# Patient Record
Sex: Female | Born: 1960 | Race: White | Hispanic: No | Marital: Married | Smoking: Never smoker
Health system: Southern US, Community
[De-identification: ages and names within clinical notes are randomized; demographics above are authoritative.]

## PROBLEM LIST (undated history)

## (undated) DIAGNOSIS — S83209A Unspecified tear of unspecified meniscus, current injury, unspecified knee, initial encounter: Secondary | ICD-10-CM

## (undated) DIAGNOSIS — T7840XA Allergy, unspecified, initial encounter: Secondary | ICD-10-CM

## (undated) DIAGNOSIS — D649 Anemia, unspecified: Secondary | ICD-10-CM

## (undated) DIAGNOSIS — R0602 Shortness of breath: Secondary | ICD-10-CM

## (undated) DIAGNOSIS — I10 Essential (primary) hypertension: Secondary | ICD-10-CM

## (undated) DIAGNOSIS — M4800 Spinal stenosis, site unspecified: Secondary | ICD-10-CM

## (undated) DIAGNOSIS — M549 Dorsalgia, unspecified: Secondary | ICD-10-CM

## (undated) DIAGNOSIS — M1990 Unspecified osteoarthritis, unspecified site: Secondary | ICD-10-CM

## (undated) DIAGNOSIS — C801 Malignant (primary) neoplasm, unspecified: Secondary | ICD-10-CM

## (undated) HISTORY — DX: Allergy, unspecified, initial encounter: T78.40XA

## (undated) HISTORY — DX: Spinal stenosis, site unspecified: M48.00

## (undated) HISTORY — DX: Essential (primary) hypertension: I10

## (undated) HISTORY — DX: Dorsalgia, unspecified: M54.9

## (undated) HISTORY — DX: Shortness of breath: R06.02

## (undated) HISTORY — DX: Malignant (primary) neoplasm, unspecified: C80.1

## (undated) HISTORY — DX: Anemia, unspecified: D64.9

## (undated) HISTORY — DX: Unspecified tear of unspecified meniscus, current injury, unspecified knee, initial encounter: S83.209A

---

## 1982-11-21 HISTORY — PX: TONSILLECTOMY: SUR1361

## 1999-01-25 ENCOUNTER — Other Ambulatory Visit: Admission: RE | Admit: 1999-01-25 | Discharge: 1999-01-25 | Payer: Self-pay | Admitting: Gynecology

## 1999-05-15 ENCOUNTER — Inpatient Hospital Stay (HOSPITAL_COMMUNITY): Admission: AD | Admit: 1999-05-15 | Discharge: 1999-05-15 | Payer: Self-pay | Admitting: Gynecology

## 1999-06-05 ENCOUNTER — Inpatient Hospital Stay (HOSPITAL_COMMUNITY): Admission: AD | Admit: 1999-06-05 | Discharge: 1999-06-05 | Payer: Self-pay | Admitting: Gynecology

## 1999-06-08 ENCOUNTER — Inpatient Hospital Stay (HOSPITAL_COMMUNITY): Admission: AD | Admit: 1999-06-08 | Discharge: 1999-06-08 | Payer: Self-pay | Admitting: Gynecology

## 1999-10-06 ENCOUNTER — Encounter: Admission: RE | Admit: 1999-10-06 | Discharge: 1999-11-26 | Payer: Self-pay | Admitting: Family Medicine

## 2001-02-27 ENCOUNTER — Other Ambulatory Visit: Admission: RE | Admit: 2001-02-27 | Discharge: 2001-02-27 | Payer: Self-pay | Admitting: Gynecology

## 2005-01-24 ENCOUNTER — Emergency Department (HOSPITAL_COMMUNITY): Admission: EM | Admit: 2005-01-24 | Discharge: 2005-01-24 | Payer: Self-pay | Admitting: Family Medicine

## 2013-10-02 ENCOUNTER — Ambulatory Visit: Payer: BC Managed Care – PPO

## 2013-10-02 ENCOUNTER — Ambulatory Visit (INDEPENDENT_AMBULATORY_CARE_PROVIDER_SITE_OTHER): Payer: BC Managed Care – PPO | Admitting: Family Medicine

## 2013-10-02 VITALS — BP 160/88 | HR 70 | Temp 98.0°F | Resp 18 | Ht 64.0 in | Wt 258.4 lb

## 2013-10-02 DIAGNOSIS — R079 Chest pain, unspecified: Secondary | ICD-10-CM

## 2013-10-02 DIAGNOSIS — R05 Cough: Secondary | ICD-10-CM

## 2013-10-02 DIAGNOSIS — R0602 Shortness of breath: Secondary | ICD-10-CM

## 2013-10-02 DIAGNOSIS — R059 Cough, unspecified: Secondary | ICD-10-CM

## 2013-10-02 DIAGNOSIS — J209 Acute bronchitis, unspecified: Secondary | ICD-10-CM

## 2013-10-02 LAB — POCT CBC
Granulocyte percent: 53.3 % (ref 37–80)
HCT, POC: 42.8 % (ref 37.7–47.9)
Hemoglobin: 13.1 g/dL (ref 12.2–16.2)
Lymph, poc: 2.2 (ref 0.6–3.4)
MCH, POC: 27.1 pg (ref 27–31.2)
MCHC: 30.6 g/dL — AB (ref 31.8–35.4)
MCV: 88.4 fL (ref 80–97)
MID (cbc): 0.4 (ref 0–0.9)
MPV: 11.2 fL (ref 0–99.8)
POC Granulocyte: 6 (ref 2–6.9)
POC LYMPH PERCENT: 38.7 %L (ref 10–50)
POC MID %: 8 %M (ref 0–12)
Platelet Count, POC: 149 10*3/uL (ref 142–424)
RBC: 4.84 M/uL (ref 4.04–5.48)
RDW, POC: 14.6 %
WBC: 5.6 10*3/uL (ref 4.6–10.2)

## 2013-10-02 MED ORDER — ALBUTEROL SULFATE HFA 108 (90 BASE) MCG/ACT IN AERS: 2.0000 | INHALATION_SPRAY | Freq: Four times a day (QID) | RESPIRATORY_TRACT | Status: DC | PRN

## 2013-10-02 MED ORDER — LEVOFLOXACIN 500 MG PO TABS: 500.0000 mg | ORAL_TABLET | Freq: Every day | ORAL | Status: DC

## 2013-10-02 NOTE — Progress Notes (Deleted)
  Subjective:    Patient ID: Natasha [name], female    DOB: [birth year], 52 y.o.   MRN: [id number]  HPI Pt presents with cough x 7-8 weeks. Pt is nonsmoker. States cough keeps her up at night. She is sob when walking. Her cough is productive. +white mucous with cough. Denies night sweats, fever. In the beginning of the illness she had sore throat, bilateral otalgias. That has resolved. She has a history of bronchitis and PNA, no asthma.    Review of Systems     Objective:   Physical Exam        Assessment & Plan:

## 2013-10-02 NOTE — Patient Instructions (Signed)
Acute Bronchitis Bronchitis is inflammation of the airways that extend from the windpipe into the lungs (bronchi). The inflammation often causes mucus to develop. This leads to a cough, which is the most common symptom of bronchitis.  In acute bronchitis, the condition usually develops suddenly and goes away over time, usually in a couple weeks. Smoking, allergies, and asthma can make bronchitis worse. Repeated episodes of bronchitis may cause further lung problems.  CAUSES Acute bronchitis is most often caused by the same virus that causes a cold. The virus can spread from person to person (contagious).  SIGNS AND SYMPTOMS   Cough.   Fever.   Coughing up mucus.   Body aches.   Chest congestion.   Chills.   Shortness of breath.   Sore throat.  DIAGNOSIS  Acute bronchitis is usually diagnosed through a physical exam. Tests, such as chest X-rays, are sometimes done to rule out other conditions.  TREATMENT  Acute bronchitis usually goes away in a couple weeks. Often times, no medical treatment is necessary. Medicines are sometimes given for relief of fever or cough. Antibiotics are usually not needed but may be prescribed in certain situations. In some cases, an inhaler may be recommended to help reduce shortness of breath and control the cough. A cool mist vaporizer may also be used to help thin bronchial secretions and make it easier to clear the chest.  HOME CARE INSTRUCTIONS  Get plenty of rest.   Drink enough fluids to keep your urine clear or pale yellow (unless you have a medical condition that requires fluid restriction). Increasing fluids may help thin your secretions and will prevent dehydration.   Only take over-the-counter or prescription medicines as directed by your health care provider.   Avoid smoking and secondhand smoke. Exposure to cigarette smoke or irritating chemicals will make bronchitis worse. If you are a smoker, consider using nicotine gum or skin  patches to help control withdrawal symptoms. Quitting smoking will help your lungs heal faster.   Reduce the chances of another bout of acute bronchitis by washing your hands frequently, avoiding people with cold symptoms, and trying not to touch your hands to your mouth, nose, or eyes.   Follow up with your health care provider as directed.  SEEK MEDICAL CARE IF: Your symptoms do not improve after 1 week of treatment.  SEEK IMMEDIATE MEDICAL CARE IF:  You develop an increased fever or chills.   You have chest pain.   You have severe shortness of breath.  You have bloody sputum.   You develop dehydration.  You develop fainting.  You develop repeated vomiting.  You develop a severe headache. MAKE SURE YOU:   Understand these instructions.  Will watch your condition.  Will get help right away if you are not doing well or get worse. Document Released: 12/15/2004 Document Revised: 07/10/2013 Document Reviewed: 04/30/2013 Exit[employer] Patient Information 2014 [employer].  

## 2013-10-02 NOTE — Progress Notes (Signed)
 [facility]:  Office Visit  Chief Complaint:  Chief Complaint  Patient presents with  . Cough    SOB wheezing symptoms since Sept 21     HPI: Natasha [name] is a 52 y.o. female who is here for productive  cough x 7-8 weeks. Pt is nonsmoker. States cough keeps her up at night. She is sob when walking. Her cough is productive. +white mucous with cough. Denies night sweats, fever. In the beginning of the illness she had sore throat, bilateral otalgias. That has resolved. She has a history of bronchitis and PNA, no asthma. She has had to sit up in recliner to sleep early on in her illness, now she is using 2 pillows, denies pedal edema. She has CP when she walks up the stairs. She can do minimal things ie do dishes and feels SOB. No wheezes now but did have some initially. Thought it was bronchitis at first. She has allergies but no ashtma. Was taking Mucinex DM but felt it may not have been helpful. Has not had fevers. Denies any PE/DVT sxs: no recent surgeries, prior PE/DVTs, long travels, asymmteric swelling in legs or leg pain, no malignancies. Denies GERD sxs  She recently lost her father 2 months ago to colon cancer, she is a Hospital doctor for [employer]   Past Medical History  Diagnosis Date  . Allergy   . Anemia   . Spinal stenosis    Past Surgical History  Procedure Laterality Date  . Cesarean section     History   Social History  . Marital Status: Married    Spouse Name: N/A    Number of Children: N/A  . Years of Education: N/A   Social History Main Topics  . Smoking status: Never Smoker   . Smokeless tobacco: Not on file  . Alcohol Use: Not on file  . Drug Use: Not on file  . Sexual Activity: Not on file   Other Topics Concern  . Not on file   Social History Narrative  . No narrative on file   Family History  Problem Relation Age of Onset  . Cancer Father    Allergies  Allergen Reactions  . Codeine Nausea Only   Prior to Admission  medications   Not on File     ROS: The patient denies fevers, chills, night sweats, unintentional weight loss, palpitation, nausea, vomiting, abdominal pain, dysuria, hematuria, melena, numbness, weakness, or tingling.   All other systems have been reviewed and were otherwise negative with the exception of those mentioned in the HPI and as above.    PHYSICAL EXAM: Filed Vitals:   10/02/13 1604  BP: 160/88  Pulse: 70  Temp: 98 F (36.7 C)  Resp: 18   Filed Vitals:   10/02/13 1604  Height: 5\' 4"  (1.626 m)  Weight: 258 lb 6.4 oz (117.209 kg)   Body mass index is 44.33 kg/(m^2).  General: Alert, no acute distress, no increase WOB, + coughing, obese HEENT:  Normocephalic, atraumatic, oropharynx patent. EOMI, PERRLA Cardiovascular:  Regular rate and rhythm, no rubs murmurs or gallops.  No Carotid bruits, radial pulse intact. No pedal edema.  Respiratory: Clear to auscultation bilaterally.  No wheezes, rales, or rhonchi.  No cyanosis, no use of accessory musculature GI: No organomegaly, abdomen is soft and non-tender, positive bowel sounds.  No masses. Skin: No rashes. Neurologic: Facial musculature symmetric. Psychiatric: Patient is appropriate throughout our interaction. Lymphatic: No cervical lymphadenopathy Musculoskeletal: Gait intact.  LABS: Results for orders placed in visit on 10/02/13  POCT CBC      Result Value Range   WBC 5.6  4.6 - 10.2 K/uL   Lymph, poc 2.2  0.6 - 3.4   POC LYMPH PERCENT 38.7  10 - 50 %L   MID (cbc) 0.4  0 - 0.9   POC MID % 8.0  0 - 12 %M   POC Granulocyte 6.0  2 - 6.9   Granulocyte percent 53.3  37 - 80 %G   RBC 4.84  4.04 - 5.48 M/uL   Hemoglobin 13.1  12.2 - 16.2 g/dL   HCT, POC 40.9  81.1 - 47.9 %   MCV 88.4  80 - 97 fL   MCH, POC 27.1  27 - 31.2 pg   MCHC 30.6 (*) 31.8 - 35.4 g/dL   RDW, POC 91.4     Platelet Count, POC 149  142 - 424 K/uL   MPV 11.2  0 - 99.8 fL     EKG/XRAY:   Primary read interpreted by Dr. Conley Rolls at  [facility]. Increase vascular markings vs patchy infiltrate right upper hilar region, please comment.  No effusion, no pneumothorax EKG shows sinus rhythm at 61 bpm, no acute abnormalities Nonspecific ST changes   ASSESSMENT/PLAN: Encounter Diagnoses  Name Primary?  . Cough Yes  . SOB (shortness of breath)   . Acute bronchitis   . 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  Possible PNA vs acute bronchitis with abnormal xray ( I will await for official xray report) Rx Levaquin for infectious etiology, RX Albuterol inhaler, she declined cough meds will take mucinex Take otc anithistamine daily for allergies  F/u in 10 days or sooner if worsening sxs for either repeat xray or other studies prn  Gross sideeffects, risk and benefits, and alternatives of medications d/w patient. Patient is aware that all medications have potential sideeffects and we are unable to predict every sideeffect or drug-drug interaction that may occur.  ,  PHUONG, DO 10/02/2013 6:20 PM  Official Xray Results CLINICAL DATA: Cough for 2 months  EXAM:  CHEST 2 VIEW  COMPARISON: None.  FINDINGS:  Cardiac shadow is within normal limits. Some fullness is noted in  the right hilar and suprahilar region best appreciated on the  frontal exam. No focal infiltrate is seen. No bony abnormality is  noted.  IMPRESSION:  Fullness in the right suprahilar and right paratracheal region as  described. CT of the chest is recommended for further evaluation to  rule out underlying abnormality.  Electronically Signed  By: Alcide Clever M.D.  On: 10/02/2013 18:33

## 2013-10-03 ENCOUNTER — Encounter: Payer: Self-pay | Admitting: Family Medicine

## 2013-10-03 ENCOUNTER — Other Ambulatory Visit: Payer: Self-pay | Admitting: Family Medicine

## 2013-10-03 DIAGNOSIS — R9389 Abnormal findings on diagnostic imaging of other specified body structures: Secondary | ICD-10-CM

## 2013-10-03 LAB — COMPREHENSIVE METABOLIC PANEL
AST: 24 U/L (ref 0–37)
CO2: 26 mEq/L (ref 19–32)
Calcium: 9.9 mg/dL (ref 8.4–10.5)
Glucose, Bld: 83 mg/dL (ref 70–99)
Total Protein: 7.2 g/dL (ref 6.0–8.3)

## 2013-10-03 LAB — COMPREHENSIVE METABOLIC PANEL WITH GFR
ALT: 28 U/L (ref 0–35)
Albumin: 4.4 g/dL (ref 3.5–5.2)
Alkaline Phosphatase: 72 U/L (ref 39–117)
BUN: 15 mg/dL (ref 6–23)
Chloride: 104 meq/L (ref 96–112)
Creat: 0.7 mg/dL (ref 0.50–1.10)
Potassium: 4.2 meq/L (ref 3.5–5.3)
Sodium: 140 meq/L (ref 135–145)
Total Bilirubin: 0.5 mg/dL (ref 0.3–1.2)

## 2013-10-04 ENCOUNTER — Encounter: Payer: Self-pay | Admitting: Family Medicine

## 2013-10-06 NOTE — Telephone Encounter (Signed)
Left a message on the patient's cell phone that we are working on the CT scan and will call her. Told her that Dr. Conley Rolls is interested in how she is feeling and if she is would like to call and let her know that would be great.

## 2013-10-07 ENCOUNTER — Other Ambulatory Visit: Payer: Self-pay | Admitting: *Deleted

## 2013-10-07 ENCOUNTER — Ambulatory Visit
Admission: RE | Admit: 2013-10-07 | Discharge: 2013-10-07 | Disposition: A | Discharge status: Home / Self Care | Payer: BC Managed Care – PPO | Source: Ambulatory Visit | Attending: Family Medicine | Admitting: Family Medicine

## 2013-10-07 DIAGNOSIS — R9389 Abnormal findings on diagnostic imaging of other specified body structures: Secondary | ICD-10-CM

## 2013-10-07 MED ORDER — IOHEXOL 300 MG/ML  SOLN
75.0000 mL | Freq: Once | INTRAMUSCULAR | Status: AC | PRN
  Administered 2013-10-07: 75 mL via INTRAVENOUS

## 2013-11-05 ENCOUNTER — Encounter: Payer: Self-pay | Admitting: Family Medicine

## 2014-03-01 ENCOUNTER — Encounter (HOSPITAL_COMMUNITY): Payer: Self-pay | Admitting: Emergency Medicine

## 2014-03-01 ENCOUNTER — Emergency Department (HOSPITAL_COMMUNITY)
Admission: EM | Admit: 2014-03-01 | Discharge: 2014-03-01 | Disposition: A | Discharge status: Home / Self Care | Payer: BC Managed Care – PPO | Source: Home / Self Care | Attending: Family Medicine | Admitting: Family Medicine

## 2014-03-01 DIAGNOSIS — N61 Mastitis without abscess: Secondary | ICD-10-CM

## 2014-03-01 DIAGNOSIS — N611 Abscess of the breast and nipple: Secondary | ICD-10-CM

## 2014-03-01 MED ORDER — CEFUROXIME AXETIL 500 MG PO TABS: 500.0000 mg | ORAL_TABLET | Freq: Two times a day (BID) | ORAL | Status: DC

## 2014-03-01 MED ORDER — SULFAMETHOXAZOLE-TMP DS 800-160 MG PO TABS: 2.0000 | ORAL_TABLET | Freq: Two times a day (BID) | ORAL | Status: DC

## 2014-03-01 NOTE — ED Provider Notes (Signed)
CSN: [id number]     Arrival date & time 03/01/14  1819 History   First MD Initiated Contact with Patient 03/01/14 1954     Chief Complaint  Patient presents with  . Breast Problem   (Consider location/radiation/quality/duration/timing/severity/associated sxs/prior Treatment) HPI Comments: 26f presents c/o 3 days or worsening pain and redness on the left breast, getting bigger and more painful gradually up until now.  She has now started to have some bleeding and drainage from the area.  Extremely tender.  She admits to some subjective fever and chills, no other systemic symptoms.  No Hx of same.  She has never had mammogram, says "that stuff doesn't run in my family"    Past Medical History  Diagnosis Date  . Allergy   . Anemia   . Spinal stenosis    Past Surgical History  Procedure Laterality Date  . Cesarean section     Family History  Problem Relation Age of Onset  . Cancer Father    History  Substance Use Topics  . Smoking status: Never Smoker   . Smokeless tobacco: Not on file  . Alcohol Use: Not on file   OB History   Grav Para Term Preterm Abortions TAB SAB Ect Mult Living                 Review of Systems  Constitutional: Positive for fever and chills.  Skin: Positive for color change and wound.       Breast pain  All other systems reviewed and are negative.   Allergies  Codeine  Home Medications   Current Outpatient Rx  Name  Route  Sig  Dispense  Refill  . albuterol (PROVENTIL HFA;VENTOLIN HFA) 108 (90 BASE) MCG/ACT inhaler   Inhalation   Inhale 2 puffs into the lungs every 6 (six) hours as needed for wheezing or shortness of breath.   1 Inhaler   0   . cefUROXime (CEFTIN) 500 MG tablet   Oral   Take 1 tablet (500 mg total) by mouth 2 (two) times daily with a meal.   20 tablet   0   . levofloxacin (LEVAQUIN) 500 MG tablet   Oral   Take 1 tablet (500 mg total) by mouth daily.   7 tablet   0   . sulfamethoxazole-trimethoprim (BACTRIM DS)  800-160 MG per tablet   Oral   Take 2 tablets by mouth 2 (two) times daily.   40 tablet   0    BP 184/90  Pulse 101  Temp(Src) 98.7 F (37.1 C) (Oral)  Resp 18  SpO2 98% Physical Exam  Nursing note and vitals reviewed. Constitutional: She is oriented to person, place, and time. Vital signs are normal. She appears well-developed and well-nourished. No distress.  HENT:  Head: Normocephalic and atraumatic.  Pulmonary/Chest: Effort normal. No respiratory distress. She exhibits tenderness and swelling.    Lymphadenopathy:    She has no axillary adenopathy.  Neurological: She is alert and oriented to person, place, and time. She has normal strength. Coordination normal.  Skin: Skin is warm and dry. No rash noted. She is not diaphoretic.  Psychiatric: She has a normal mood and affect. Judgment normal.    ED Course  Procedures (including critical care time) Labs Review Labs Reviewed  CULTURE, ROUTINE-ABSCESS   Imaging Review No results found.   MDM   1. Abscess of breast   2. Cellulitis of breast     Culture sent.  Put of ceftin and bactrim.  She declines pain meds, ibuprofen PRN.  She will follow up in 2 days for a re-check   Meds ordered this encounter  Medications  . sulfamethoxazole-trimethoprim (BACTRIM DS) 800-160 MG per tablet    Sig: Take 2 tablets by mouth 2 (two) times daily.    Dispense:  40 tablet    Refill:  0    Order Specific Question:  Supervising Provider    Answer:  Lynne Leader, [place]  . cefUROXime (CEFTIN) 500 MG tablet    Sig: Take 1 tablet (500 mg total) by mouth 2 (two) times daily with a meal.    Dispense:  20 tablet    Refill:  0    Order Specific Question:  Supervising Provider    Answer:  Lynne Leader, [place]     Liam Graham, PA-C 03/02/14 2044

## 2014-03-01 NOTE — Discharge Instructions (Signed)
Abscess °An abscess is an infected area that contains a collection of pus and debris. It can occur in almost any part of the body. An abscess is also known as a furuncle or boil. °CAUSES  °An abscess occurs when tissue gets infected. This can occur from blockage of oil or sweat glands, infection of hair follicles, or a minor injury to the skin. As the body tries to fight the infection, pus collects in the area and creates pressure under the skin. This pressure causes pain. People with weakened immune systems have difficulty fighting infections and get certain abscesses more often.  °SYMPTOMS °Usually an abscess develops on the skin and becomes a painful mass that is red, warm, and tender. If the abscess forms under the skin, you may feel a moveable soft area under the skin. Some abscesses break open (rupture) on their own, but most will continue to get worse without care. The infection can spread deeper into the body and eventually into the bloodstream, causing you to feel ill.  °DIAGNOSIS  °Your caregiver will take your medical history and perform a physical exam. A sample of fluid may also be taken from the abscess to determine what is causing your infection. °TREATMENT  °Your caregiver may prescribe antibiotic medicines to fight the infection. However, taking antibiotics alone usually does not cure an abscess. Your caregiver may need to make a small cut (incision) in the abscess to drain the pus. In some cases, gauze is packed into the abscess to reduce pain and to continue draining the area. °HOME CARE INSTRUCTIONS  °· Only take over-the-counter or prescription medicines for pain, discomfort, or fever as directed by your caregiver. °· If you were prescribed antibiotics, take them as directed. Finish them even if you start to feel better. °· If gauze is used, follow your caregiver's directions for changing the gauze. °· To avoid spreading the infection: °· Keep your draining abscess covered with a  bandage. °· Wash your hands well. °· Do not share personal care items, towels, or whirlpools with others. °· Avoid skin contact with others. °· Keep your skin and clothes clean around the abscess. °· Keep all follow-up appointments as directed by your caregiver. °SEEK MEDICAL CARE IF:  °· You have increased pain, swelling, redness, fluid drainage, or bleeding. °· You have muscle aches, chills, or a general ill feeling. °· You have a fever. °MAKE SURE YOU:  °· Understand these instructions. °· Will watch your condition. °· Will get help right away if you are not doing well or get worse. °Document Released: 08/17/2005 Document Revised: 05/08/2012 Document Reviewed: 01/20/2012 °[employer]® Patient Information ©2014 [employer]. ° °Cellulitis °Cellulitis is an infection of the skin and the tissue beneath it. The infected area is usually red and tender. Cellulitis occurs most often in the arms and lower legs.  °CAUSES  °Cellulitis is caused by bacteria that enter the skin through cracks or cuts in the skin. The most common types of bacteria that cause cellulitis are Staphylococcus and Streptococcus. °SYMPTOMS  °· Redness and warmth. °· Swelling. °· Tenderness or pain. °· Fever. °DIAGNOSIS  °Your caregiver can usually determine what is wrong based on a physical exam. Blood tests may also be done. °TREATMENT  °Treatment usually involves taking an antibiotic medicine. °HOME CARE INSTRUCTIONS  °· Take your antibiotics as directed. Finish them even if you start to feel better. °· Keep the infected arm or leg elevated to reduce swelling. °· Apply a warm cloth to the affected area up to   4 times per day to relieve pain. °· Only take over-the-counter or prescription medicines for pain, discomfort, or fever as directed by your caregiver. °· Keep all follow-up appointments as directed by your caregiver. °SEEK MEDICAL CARE IF:  °· You notice red streaks coming from the infected area. °· Your red area gets larger or turns dark in  color. °· Your bone or joint underneath the infected area becomes painful after the skin has healed. °· Your infection returns in the same area or another area. °· You notice a swollen bump in the infected area. °· You develop new symptoms. °SEEK IMMEDIATE MEDICAL CARE IF:  °· You have a fever. °· You feel very sleepy. °· You develop vomiting or diarrhea. °· You have a general ill feeling (malaise) with muscle aches and pains. °MAKE SURE YOU:  °· Understand these instructions. °· Will watch your condition. °· Will get help right away if you are not doing well or get worse. °Document Released: 08/17/2005 Document Revised: 05/08/2012 Document Reviewed: 01/23/2012 °ExitCare® Patient Information ©2014 [employer]. ° °

## 2014-03-01 NOTE — ED Notes (Addendum)
States she noted a painful area on her left breast 2 days ago which has worsened in spite of OTC antibiotic ointment . No known injury or puncture wound. Area aprox 10 x 12 cm erythema noted

## 2014-03-03 ENCOUNTER — Encounter (HOSPITAL_COMMUNITY): Payer: Self-pay | Admitting: Emergency Medicine

## 2014-03-03 ENCOUNTER — Emergency Department (INDEPENDENT_AMBULATORY_CARE_PROVIDER_SITE_OTHER)
Admission: EM | Admit: 2014-03-03 | Discharge: 2014-03-03 | Disposition: A | Discharge status: Home / Self Care | Payer: BC Managed Care – PPO | Source: Home / Self Care | Attending: Emergency Medicine | Admitting: Emergency Medicine

## 2014-03-03 DIAGNOSIS — N611 Abscess of the breast and nipple: Secondary | ICD-10-CM

## 2014-03-03 DIAGNOSIS — N61 Mastitis without abscess: Secondary | ICD-10-CM

## 2014-03-03 MED ORDER — CEFTRIAXONE SODIUM 1 G IJ SOLR
2.0000 g | Freq: Once | INTRAMUSCULAR | Status: AC
  Administered 2014-03-03: 2 g via INTRAMUSCULAR

## 2014-03-03 MED ORDER — CEFTRIAXONE SODIUM 1 G IJ SOLR
INTRAMUSCULAR | Status: AC
  Filled 2014-03-03: qty 20

## 2014-03-03 MED ORDER — LIDOCAINE HCL (PF) 1 % IJ SOLN
INTRAMUSCULAR | Status: AC
  Filled 2014-03-03: qty 10

## 2014-03-03 NOTE — ED Notes (Signed)
Pt given injection.  Will discharge at 10:50 a.m.  Mw,cma

## 2014-03-03 NOTE — ED Notes (Signed)
Pt her for follow up of breast wound.  States "I think it looks a lot more red than in the beginning".   Pt is using warm compresses and neosporin for treatment.

## 2014-03-03 NOTE — Discharge Instructions (Signed)
Continue taking the antibiotics. Continue warm compresses. Go to the emergency room if she started to get sick with fever   Cellulitis Cellulitis is an infection of the skin and the tissue beneath it. The infected area is usually red and tender. Cellulitis occurs most often in the arms and lower legs.  CAUSES  Cellulitis is caused by bacteria that enter the skin through cracks or cuts in the skin. The most common types of bacteria that cause cellulitis are Staphylococcus and Streptococcus. SYMPTOMS   Redness and warmth.  Swelling.  Tenderness or pain.  Fever. DIAGNOSIS  Your caregiver can usually determine what is wrong based on a physical exam. Blood tests may also be done. TREATMENT  Treatment usually involves taking an antibiotic medicine. HOME CARE INSTRUCTIONS   Take your antibiotics as directed. Finish them even if you start to feel better.  Keep the infected arm or leg elevated to reduce swelling.  Apply a warm cloth to the affected area up to 4 times per day to relieve pain.  Only take over-the-counter or prescription medicines for pain, discomfort, or fever as directed by your caregiver.  Keep all follow-up appointments as directed by your caregiver. SEEK MEDICAL CARE IF:   You notice red streaks coming from the infected area.  Your red area gets larger or turns dark in color.  Your bone or joint underneath the infected area becomes painful after the skin has healed.  Your infection returns in the same area or another area.  You notice a swollen bump in the infected area.  You develop new symptoms. SEEK IMMEDIATE MEDICAL CARE IF:   You have a fever.  You feel very sleepy.  You develop vomiting or diarrhea.  You have a general ill feeling (malaise) with muscle aches and pains. MAKE SURE YOU:   Understand these instructions.  Will watch your condition.  Will get help right away if you are not doing well or get worse. Document Released: 08/17/2005  Document Revised: 05/08/2012 Document Reviewed: 01/23/2012 [facility] Patient Information 2014 [place].  Abscess An abscess (boil or furuncle) is an infected area on or under the skin. This area is filled with yellowish-white fluid (pus) and other material (debris). HOME CARE   Only take medicines as told by your doctor.  If you were given antibiotic medicine, take it as directed. Finish the medicine even if you start to feel better.  If gauze is used, follow your doctor's directions for changing the gauze.  To avoid spreading the infection:  Keep your abscess covered with a bandage.  Wash your hands well.  Do not share personal care items, towels, or whirlpools with others.  Avoid skin contact with others.  Keep your skin and clothes clean around the abscess.  Keep all doctor visits as told. GET HELP RIGHT AWAY IF:   You have more pain, puffiness (swelling), or redness in the wound site.  You have more fluid or blood coming from the wound site.  You have muscle aches, chills, or you feel sick.  You have a fever. MAKE SURE YOU:   Understand these instructions.  Will watch your condition.  Will get help right away if you are not doing well or get worse. Document Released: 04/25/2008 Document Revised: 05/08/2012 Document Reviewed: 01/20/2012 [facility] Patient Information 2014 [place].

## 2014-03-03 NOTE — ED Provider Notes (Signed)
Medical screening examination/treatment/procedure(s) were performed by non-physician practitioner and as supervising physician I was immediately available for consultation/collaboration.  Philipp Deputy, M.D.  Harden Mo, MD 03/03/14 [phone]

## 2014-03-03 NOTE — ED Provider Notes (Signed)
CSN: [id number]     Arrival date & time 03/03/14  0902 History   First MD Initiated Contact with Patient 03/03/14 [phone]     Chief Complaint  Patient presents with  . Follow-up    breast wound   (Consider location/radiation/quality/duration/timing/severity/associated sxs/prior Treatment) HPI Comments: 53 year old female presents for followup of breast abscess and cellulitis. The pain is better, the redness has expanded. She does not feel sick. She is taking the antibiotics as prescribed-Ceftin and Bactrim. No fever. The wound is still draining.   Past Medical History  Diagnosis Date  . Allergy   . Anemia   . Spinal stenosis    Past Surgical History  Procedure Laterality Date  . Cesarean section     Family History  Problem Relation Age of Onset  . Cancer Father    History  Substance Use Topics  . Smoking status: Never Smoker   . Smokeless tobacco: Not on file  . Alcohol Use: No   OB History   Grav Para Term Preterm Abortions TAB SAB Ect Mult Living                 Review of Systems  Constitutional: Negative for fever and chills.  Skin: Positive for color change and wound.       Breast pain  All other systems reviewed and are negative.   Allergies  Codeine  Home Medications   Current Outpatient Rx  Name  Route  Sig  Dispense  Refill  . cefUROXime (CEFTIN) 500 MG tablet   Oral   Take 1 tablet (500 mg total) by mouth 2 (two) times daily with a meal.   20 tablet   0   . sulfamethoxazole-trimethoprim (BACTRIM DS) 800-160 MG per tablet   Oral   Take 2 tablets by mouth 2 (two) times daily.   40 tablet   0   . albuterol (PROVENTIL HFA;VENTOLIN HFA) 108 (90 BASE) MCG/ACT inhaler   Inhalation   Inhale 2 puffs into the lungs every 6 (six) hours as needed for wheezing or shortness of breath.   1 Inhaler   0   . levofloxacin (LEVAQUIN) 500 MG tablet   Oral   Take 1 tablet (500 mg total) by mouth daily.   7 tablet   0    BP 144/82  Pulse 74  Temp(Src) 98.5  F (36.9 C) (Oral)  Resp 12  SpO2 99% Physical Exam  Nursing note and vitals reviewed. Constitutional: She is oriented to person, place, and time. Vital signs are normal. She appears well-developed and well-nourished. No distress.  HENT:  Head: Normocephalic and atraumatic.  Pulmonary/Chest: Effort normal. No respiratory distress. She exhibits tenderness and swelling.    Lymphadenopathy:    She has no axillary adenopathy.  Neurological: She is alert and oriented to person, place, and time. She has normal strength. Coordination normal.  Skin: Skin is warm and dry. No rash noted. She is not diaphoretic.  Psychiatric: She has a normal mood and affect. Judgment normal.    ED Course  Procedures (including critical care time) Labs Review Labs Reviewed - No data to display Imaging Review No results found.   MDM   1. Cellulitis of breast   2. Breast abscess    The area of cellulitis was marked with a pen. We'll give Rocephin and she will continue antibiotics. Followup for a recheck in 48 hours.  Meds ordered this encounter  Medications  . cefTRIAXone (ROCEPHIN) injection 2 g    Sig:  Liam Graham, PA-C 03/03/14 1048

## 2014-03-04 LAB — CULTURE, ROUTINE-ABSCESS

## 2014-03-04 NOTE — ED Notes (Signed)
Abscess culture: Mod. Microaerophilic streptococci-no sensitivity.  Treated with Ceftin and Bactrim DS.  Message sent to Z. Baker PA. Hanley Seamen [facility] 03/04/2014

## 2014-03-04 NOTE — ED Provider Notes (Signed)
Medical screening examination/treatment/procedure(s) were performed by a resident physician or non-physician practitioner and as the supervising physician I was immediately available for consultation/collaboration.  Evan Corey, MD    Evan S Corey, MD 03/04/14 1635 

## 2014-03-05 ENCOUNTER — Emergency Department (INDEPENDENT_AMBULATORY_CARE_PROVIDER_SITE_OTHER)
Admission: EM | Admit: 2014-03-05 | Discharge: 2014-03-05 | Disposition: A | Discharge status: Home / Self Care | Payer: BC Managed Care – PPO | Source: Home / Self Care | Attending: Emergency Medicine | Admitting: Emergency Medicine

## 2014-03-05 ENCOUNTER — Encounter (HOSPITAL_COMMUNITY): Payer: Self-pay | Admitting: Emergency Medicine

## 2014-03-05 DIAGNOSIS — N61 Mastitis without abscess: Secondary | ICD-10-CM

## 2014-03-05 DIAGNOSIS — N611 Abscess of the breast and nipple: Secondary | ICD-10-CM

## 2014-03-05 NOTE — ED Notes (Signed)
Patient in department for reevaluation of breast abscess, left breast.  Has been taking antibiotics appropriately, using antibiotic ointment and warm compresses.  Patient rates wound as improved: no spreading of redness, decreased drainage, and pain is improved.

## 2014-03-05 NOTE — ED Provider Notes (Signed)
CSN: [id number]     Arrival date & time 03/05/14  1034 History   First MD Initiated Contact with Patient 03/05/14 1125     Chief Complaint  Patient presents with  . Follow-up   (Consider location/radiation/quality/duration/timing/severity/associated sxs/prior Treatment) HPI  53 year old female who presents for followup of left breast abscess and cellulitis. The patient was evaluated for this originally on April 11. At that time she was prescribed Bactrim and Ceftin, and a culture of the abscess was sent. She followed up 2 days later and was given an injection of Rocephin IM. She is instructed to continue the oral antibiotics and followup in 48 hours. Today she presents for evaluation and notes that the pain and drainage have both decreased. She believes that the redness is the same. She is continuing to take the Bactrim and Ceftin without problems. She denies any fever, chills, nausea, or vomiting. Her pain is mild. She's not taking any medication for the pain.  Past Medical History  Diagnosis Date  . Allergy   . Anemia   . Spinal stenosis    Past Surgical History  Procedure Laterality Date  . Cesarean section     Family History  Problem Relation Age of Onset  . Cancer Father    History  Substance Use Topics  . Smoking status: Never Smoker   . Smokeless tobacco: Not on file  . Alcohol Use: No   OB History   Grav Para Term Preterm Abortions TAB SAB Ect Mult Living                 Review of Systems Negative for fever or chills Pos for minimum drainage and redness of right breast  Allergies  Codeine  Home Medications   Prior to Admission medications   Medication Sig Start Date End Date Taking? Authorizing Provider  albuterol (PROVENTIL HFA;VENTOLIN HFA) 108 (90 BASE) MCG/ACT inhaler Inhale 2 puffs into the lungs every 6 (six) hours as needed for wheezing or shortness of breath. 10/02/13   Thao P Le, DO  cefUROXime (CEFTIN) 500 MG tablet Take 1 tablet (500 mg total) by  mouth 2 (two) times daily with a meal. 03/01/14   Liam Graham, PA-C  levofloxacin (LEVAQUIN) 500 MG tablet Take 1 tablet (500 mg total) by mouth daily. 10/02/13   Thao P Le, DO  sulfamethoxazole-trimethoprim (BACTRIM DS) 800-160 MG per tablet Take 2 tablets by mouth 2 (two) times daily. 03/01/14   Liam Graham, PA-C   There were no vitals taken for this visit. Physical Exam  Constitutional: She appears well-developed and well-nourished.  Pulmonary/Chest: Right breast exhibits no mass, no nipple discharge and no skin change. Left breast exhibits skin change and tenderness. Left breast exhibits no mass and no nipple discharge.    Erythema of left interior lateral breast with small patent abscess opening without active drainage; no palpable fluctuance or loculation of fluid    ED Course  Procedures (including critical care time) Labs Review Labs Reviewed - No data to display  Results for orders placed during the hospital encounter of 03/01/14  CULTURE, ROUTINE-ABSCESS      Result Value Ref Range   Specimen Description ABSCESS LEFT BREAST     Special Requests NONE     Gram Stain       Value: MODERATE WBC PRESENT, PREDOMINANTLY PMN     NO SQUAMOUS EPITHELIAL CELLS SEEN     ABUNDANT GRAM POSITIVE COCCI IN PAIRS     IN CHAINS  Performed at [facility]   Culture       Value: MODERATE MICROAEROPHILIC STREPTOCOCCI     Note: Standardized susceptibility testing for this organism is not available.     Performed at [facility]   Report Status 03/04/2014 FINAL     Imaging Review No results found.   MDM   1. Abscess of breast, left   2. Cellulitis of breast    Given the decreased drainage and pain along with no fever or signs of systemic illness, I believe that it is appropriate to continue to the PO antibiotics and recheck in 48 hours. Pt agreeable to plan and knows indications for earlier return.     Angelica Ran, MD 03/05/14 1235

## 2014-03-05 NOTE — Discharge Instructions (Signed)
Ms. Oluwatoyin, Banales to meet you. Given that the pain and drainage are both improved, I think that you should continue the oral antibiotics. If you develop worsening redness, pain, or drainage, then please go to the ED. Otherwise, please come back in 48 hours for a re-check. Continue to wash with warm water and dial soap.   Take Care,   Dr. Maricela Bo

## 2014-03-06 NOTE — ED Notes (Signed)
4/13 Pt. Received 2 G Rocephin.  4/15  Lab reviewed by Dr. Maricela Bo on.  No change in antibiotics. Pt. Improved. Hanley Seamen [facility] 03/06/2014

## 2014-03-06 NOTE — ED Provider Notes (Signed)
Medical screening examination/treatment/procedure(s) were performed by a resident physician and as supervising physician I was immediately available for consultation/collaboration.  Philipp Deputy, M.D.  Harden Mo, MD 03/06/14 1340

## 2014-03-07 ENCOUNTER — Emergency Department (INDEPENDENT_AMBULATORY_CARE_PROVIDER_SITE_OTHER)
Admission: EM | Admit: 2014-03-07 | Discharge: 2014-03-07 | Disposition: A | Discharge status: Home / Self Care | Payer: BC Managed Care – PPO | Source: Home / Self Care | Attending: Family Medicine | Admitting: Family Medicine

## 2014-03-07 ENCOUNTER — Encounter (HOSPITAL_COMMUNITY): Payer: Self-pay | Admitting: Emergency Medicine

## 2014-03-07 DIAGNOSIS — Z5189 Encounter for other specified aftercare: Secondary | ICD-10-CM

## 2014-03-07 DIAGNOSIS — N61 Mastitis without abscess: Secondary | ICD-10-CM

## 2014-03-07 NOTE — Discharge Instructions (Signed)
Warm compress twice a day when you take the antibiotic, take all of medicine, return as needed. °

## 2014-03-07 NOTE — ED Provider Notes (Signed)
CSN: [id number]     Arrival date & time 03/07/14  1034 History   First MD Initiated Contact with Patient 03/07/14 1117     Chief Complaint  Patient presents with  . Wound Check   (Consider location/radiation/quality/duration/timing/severity/associated sxs/prior Treatment) Patient is a 53 y.o. female presenting with wound check. The history is provided by the patient.  Wound Check This is a new problem. The current episode started more than 1 week ago (mult visits for same, last was 4/15, dramatic improvement, min drainage, no pain.). The problem has been rapidly improving.    Past Medical History  Diagnosis Date  . Allergy   . Anemia   . Spinal stenosis    Past Surgical History  Procedure Laterality Date  . Cesarean section     Family History  Problem Relation Age of Onset  . Cancer Father    History  Substance Use Topics  . Smoking status: Never Smoker   . Smokeless tobacco: Not on file  . Alcohol Use: No   OB History   Grav Para Term Preterm Abortions TAB SAB Ect Mult Living                 Review of Systems  Constitutional: Negative.   Skin: Positive for wound.    Allergies  Codeine  Home Medications   Prior to Admission medications   Medication Sig Start Date End Date Taking? Authorizing Provider  albuterol (PROVENTIL HFA;VENTOLIN HFA) 108 (90 BASE) MCG/ACT inhaler Inhale 2 puffs into the lungs every 6 (six) hours as needed for wheezing or shortness of breath. 10/02/13   Thao P Le, DO  cefUROXime (CEFTIN) 500 MG tablet Take 1 tablet (500 mg total) by mouth 2 (two) times daily with a meal. 03/01/14   Liam Graham, PA-C  levofloxacin (LEVAQUIN) 500 MG tablet Take 1 tablet (500 mg total) by mouth daily. 10/02/13   Thao P Le, DO  sulfamethoxazole-trimethoprim (BACTRIM DS) 800-160 MG per tablet Take 2 tablets by mouth 2 (two) times daily. 03/01/14   Freeman Caldron Baker, PA-C   BP 179/86  Pulse 84  Temp(Src) 98.5 F (36.9 C) (Oral)  Resp 18  SpO2 98% Physical  Exam  Nursing note and vitals reviewed. Constitutional: She is oriented to person, place, and time. She appears well-developed and well-nourished.  Pulmonary/Chest: She exhibits no tenderness.  Neurological: She is alert and oriented to person, place, and time.  Skin: Skin is warm and dry.  Left breast lesion nearly resolved.    ED Course  Procedures (including critical care time) Labs Review Labs Reviewed - No data to display  Results for orders placed during the hospital encounter of 03/01/14  CULTURE, ROUTINE-ABSCESS      Result Value Ref Range   Specimen Description ABSCESS LEFT BREAST     Special Requests NONE     Gram Stain       Value: MODERATE WBC PRESENT, PREDOMINANTLY PMN     NO SQUAMOUS EPITHELIAL CELLS SEEN     ABUNDANT GRAM POSITIVE COCCI IN PAIRS     IN CHAINS     Performed at [facility]   Culture       Value: MODERATE MICROAEROPHILIC STREPTOCOCCI     Note: Standardized susceptibility testing for this organism is not available.     Performed at [facility]   Report Status 03/04/2014 FINAL     Imaging Review No results found.   MDM   1. Wound check, abscess  Billy Fischer, MD 03/07/14 [phone]

## 2014-03-07 NOTE — ED Notes (Signed)
Pt  Here  For  A  Recheck  Of  Breast    Infection          Pt  States  She  Is  Doing  Better        She  Reports  She  Get  Her  meds  Filled

## 2014-09-05 ENCOUNTER — Other Ambulatory Visit: Payer: Self-pay

## 2015-09-10 ENCOUNTER — Ambulatory Visit (INDEPENDENT_AMBULATORY_CARE_PROVIDER_SITE_OTHER): Payer: BLUE CROSS/BLUE SHIELD | Admitting: Emergency Medicine

## 2015-09-10 VITALS — BP 150/82 | HR 82 | Temp 98.5°F | Resp 18 | Ht 62.0 in | Wt 250.8 lb

## 2015-09-10 DIAGNOSIS — J209 Acute bronchitis, unspecified: Secondary | ICD-10-CM | POA: Diagnosis not present

## 2015-09-10 DIAGNOSIS — J0140 Acute pansinusitis, unspecified: Secondary | ICD-10-CM

## 2015-09-10 MED ORDER — AMOXICILLIN-POT CLAVULANATE 875-125 MG PO TABS: 1.0000 | ORAL_TABLET | Freq: Two times a day (BID) | ORAL | Status: DC

## 2015-09-10 MED ORDER — HYDROCOD POLST-CPM POLST ER 10-8 MG/5ML PO SUER: 5.0000 mL | Freq: Two times a day (BID) | ORAL | Status: DC

## 2015-09-10 NOTE — Patient Instructions (Signed)
Acute Bronchitis Bronchitis is inflammation of the airways that extend from the windpipe into the lungs (bronchi). The inflammation often causes mucus to develop. This leads to a cough, which is the most common symptom of bronchitis.  In acute bronchitis, the condition usually develops suddenly and goes away over time, usually in a couple weeks. Smoking, allergies, and asthma can make bronchitis worse. Repeated episodes of bronchitis may cause further lung problems.  CAUSES Acute bronchitis is most often caused by the same virus that causes a cold. The virus can spread from person to person (contagious) through coughing, sneezing, and touching contaminated objects. SIGNS AND SYMPTOMS   Cough.   Fever.   Coughing up mucus.   Body aches.   Chest congestion.   Chills.   Shortness of breath.   Sore throat.  DIAGNOSIS  Acute bronchitis is usually diagnosed through a physical exam. Your health care provider will also ask you questions about your medical history. Tests, such as chest X-rays, are sometimes done to rule out other conditions.  TREATMENT  Acute bronchitis usually goes away in a couple weeks. Oftentimes, no medical treatment is necessary. Medicines are sometimes given for relief of fever or cough. Antibiotic medicines are usually not needed but may be prescribed in certain situations. In some cases, an inhaler may be recommended to help reduce shortness of breath and control the cough. A cool mist vaporizer may also be used to help thin bronchial secretions and make it easier to clear the chest.  HOME CARE INSTRUCTIONS  Get plenty of rest.   Drink enough fluids to keep your urine clear or pale yellow (unless you have a medical condition that requires fluid restriction). Increasing fluids may help thin your respiratory secretions (sputum) and reduce chest congestion, and it will prevent dehydration.   Take medicines only as directed by your health care provider.  If  you were prescribed an antibiotic medicine, finish it all even if you start to feel better.  Avoid smoking and secondhand smoke. Exposure to cigarette smoke or irritating chemicals will make bronchitis worse. If you are a smoker, consider using nicotine gum or skin patches to help control withdrawal symptoms. Quitting smoking will help your lungs heal faster.   Reduce the chances of another bout of acute bronchitis by washing your hands frequently, avoiding people with cold symptoms, and trying not to touch your hands to your mouth, nose, or eyes.   Keep all follow-up visits as directed by your health care provider.  SEEK MEDICAL CARE IF: Your symptoms do not improve after 1 week of treatment.  SEEK IMMEDIATE MEDICAL CARE IF:  You develop an increased fever or chills.   You have chest pain.   You have severe shortness of breath.  You have bloody sputum.   You develop dehydration.  You faint or repeatedly feel like you are going to pass out.  You develop repeated vomiting.  You develop a severe headache. MAKE SURE YOU:   Understand these instructions.  Will watch your condition.  Will get help right away if you are not doing well or get worse.   This information is not intended to replace advice given to you by your health care provider. Make sure you discuss any questions you have with your health care provider.   Document Released: 12/15/2004 Document Revised: 11/28/2014 Document Reviewed: 04/30/2013 Elsevier Interactive Patient Education 2016 [employer] Sinusitis, Adult Sinusitis is redness, soreness, and inflammation of the paranasal sinuses. Paranasal sinuses are air pockets within the   bones of your face. They are located beneath your eyes, in the middle of your forehead, and above your eyes. In healthy paranasal sinuses, mucus is able to drain out, and air is able to circulate through them by way of your nose. However, when your paranasal sinuses are inflamed,  mucus and air can become trapped. This can allow bacteria and other germs to grow and cause infection. Sinusitis can develop quickly and last only a short time (acute) or continue over a long period (chronic). Sinusitis that lasts for more than 12 weeks is considered chronic. CAUSES Causes of sinusitis include:  Allergies.  Structural abnormalities, such as displacement of the cartilage that separates your nostrils (deviated septum), which can decrease the air flow through your nose and sinuses and affect sinus drainage.  Functional abnormalities, such as when the small hairs (cilia) that line your sinuses and help remove mucus do not work properly or are not present. SIGNS AND SYMPTOMS Symptoms of acute and chronic sinusitis are the same. The primary symptoms are pain and pressure around the affected sinuses. Other symptoms include:  Upper toothache.  Earache.  Headache.  Bad breath.  Decreased sense of smell and taste.  A cough, which worsens when you are lying flat.  Fatigue.  Fever.  Thick drainage from your nose, which often is green and may contain pus (purulent).  Swelling and warmth over the affected sinuses. DIAGNOSIS Your health care provider will perform a physical exam. During your exam, your health care provider may perform any of the following to help determine if you have acute sinusitis or chronic sinusitis:  Look in your nose for signs of abnormal growths in your nostrils (nasal polyps).  Tap over the affected sinus to check for signs of infection.  View the inside of your sinuses using an imaging device that has a light attached (endoscope). If your health care provider suspects that you have chronic sinusitis, one or more of the following tests may be recommended:  Allergy tests.  Nasal culture. A sample of mucus is taken from your nose, sent to a lab, and screened for bacteria.  Nasal cytology. A sample of mucus is taken from your nose and examined by  your health care provider to determine if your sinusitis is related to an allergy. TREATMENT Most cases of acute sinusitis are related to a viral infection and will resolve on their own within 10 days. Sometimes, medicines are prescribed to help relieve symptoms of both acute and chronic sinusitis. These may include pain medicines, decongestants, nasal steroid sprays, or saline sprays. However, for sinusitis related to a bacterial infection, your health care provider will prescribe antibiotic medicines. These are medicines that will help kill the bacteria causing the infection. Rarely, sinusitis is caused by a fungal infection. In these cases, your health care provider will prescribe antifungal medicine. For some cases of chronic sinusitis, surgery is needed. Generally, these are cases in which sinusitis recurs more than 3 times per year, despite other treatments. HOME CARE INSTRUCTIONS  Drink plenty of water. Water helps thin the mucus so your sinuses can drain more easily.  Use a humidifier.  Inhale steam 3-4 times a day (for example, sit in the bathroom with the shower running).  Apply a warm, moist washcloth to your face 3-4 times a day, or as directed by your health care provider.  Use saline nasal sprays to help moisten and clean your sinuses.  Take medicines only as directed by your health care provider.  If   you were prescribed either an antibiotic or antifungal medicine, finish it all even if you start to feel better. SEEK IMMEDIATE MEDICAL CARE IF:  You have increasing pain or severe headaches.  You have nausea, vomiting, or drowsiness.  You have swelling around your face.  You have vision problems.  You have a stiff neck.  You have difficulty breathing.   This information is not intended to replace advice given to you by your health care provider. Make sure you discuss any questions you have with your health care provider.   Document Released: 11/07/2005 Document  Revised: 11/28/2014 Document Reviewed: 11/22/2011 Elsevier Interactive Patient Education 2016 [employer] Inc[employer]  

## 2015-09-10 NOTE — Progress Notes (Signed)
Subjective:  Patient ID: Natasha [name], female    DOB: [birth year]  Age: 54 y.o. MRN: [id number]  CC: Cough; Nasal Congestion; and Shortness of Breath   HPI Natasha [name] presents  patient with has a 3 day history of nasal congestion or nasal discharge is purulent color Natasha [name] has postnasal drip so a cough productive purulent sputum. Natasha [name] has some exertional shortness of breath and wheezing. Natasha [name] has used an albuterol inhaler in the past for reactive airway disease. Natasha [name] has no nausea vomiting or stool change. No fever chills no rash  History Natasha [name] has a past medical history of Allergy; Anemia; Spinal stenosis; and Cancer ([place]).   Natasha [name] has past surgical history that includes Cesarean section.   Natasha [name]  family history includes Cancer in Natasha [name] father.  Natasha [name]   reports that Natasha [name] has never smoked. Natasha [name] does not have any smokeless tobacco history on file. Natasha [name] reports that Natasha [name] does not drink alcohol or use illicit drugs.  Outpatient Prescriptions Prior to Visit  Medication Sig Dispense Refill  . albuterol (PROVENTIL HFA;VENTOLIN HFA) 108 (90 BASE) MCG/ACT inhaler Inhale 2 puffs into the lungs every 6 (six) hours as needed for wheezing or shortness of breath. (Patient not taking: Reported on 09/10/2015) 1 Inhaler 0  . cefUROXime (CEFTIN) 500 MG tablet Take 1 tablet (500 mg total) by mouth 2 (two) times daily with a meal. (Patient not taking: Reported on 09/10/2015) 20 tablet 0  . levofloxacin (LEVAQUIN) 500 MG tablet Take 1 tablet (500 mg total) by mouth daily. (Patient not taking: Reported on 09/10/2015) 7 tablet 0  . sulfamethoxazole-trimethoprim (BACTRIM DS) 800-160 MG per tablet Take 2 tablets by mouth 2 (two) times daily. (Patient not taking: Reported on 09/10/2015) 40 tablet 0   No facility-administered medications prior to visit.    Social History   Social History  . Marital Status: Married    Spouse Name: N/A  . Number of Children: N/A  . Years of Education: N/A   Social History Main Topics    . Smoking status: Never Smoker   . Smokeless tobacco: None  . Alcohol Use: No  . Drug Use: No  . Sexual Activity: Yes   Other Topics Concern  . None   Social History Narrative     Review of Systems  Constitutional: Positive for fatigue. Negative for fever, chills and appetite change.  HENT: Positive for congestion, postnasal drip, rhinorrhea, sinus pressure and sore throat. Negative for ear pain.   Eyes: Negative for pain and redness.  Respiratory: Positive for cough and shortness of breath. Negative for wheezing.   Cardiovascular: Negative for leg swelling.  Gastrointestinal: Negative for nausea, vomiting, abdominal pain, diarrhea, constipation and blood in stool.  Endocrine: Negative for polyuria.  Genitourinary: Negative for dysuria, urgency, frequency and flank pain.  Musculoskeletal: Negative for gait problem.  Skin: Negative for rash.  Neurological: Negative for weakness and headaches.  Psychiatric/Behavioral: Negative for confusion and decreased concentration. The patient is not nervous/anxious.     Objective:  BP 150/82 mmHg  Pulse 82  Temp(Src) 98.5 F (36.9 C) (Oral)  Resp 18  Ht 5\' 2"  (1.575 m)  Wt 250 lb 12.8 oz (113.762 kg)  BMI 45.86 kg/m2  SpO2 98%  Physical Exam  Constitutional: Natasha [name] is oriented to person, place, and time. Natasha [name] appears well-developed and well-nourished. No distress.  HENT:  Head: Normocephalic and atraumatic.  Right Ear: External ear normal.  Left Ear: External ear normal.  Nose: Rhinorrhea present.  Mouth/Throat: Posterior  oropharyngeal erythema present.  Eyes: Conjunctivae and EOM are normal. Pupils are equal, round, and reactive to light. No scleral icterus.  Neck: Normal range of motion. Neck supple. No tracheal deviation present.  Cardiovascular: Normal rate, regular rhythm and normal heart sounds.   Pulmonary/Chest: Effort normal. No respiratory distress. Natasha [name] has no wheezes. Natasha [name] has no rales.  Abdominal: Natasha [name] exhibits no mass.  There is no tenderness. There is no rebound and no guarding.  Musculoskeletal: Natasha [name] exhibits no edema.  Lymphadenopathy:    Natasha [name] has no cervical adenopathy.  Neurological: Natasha [name] is alert and oriented to person, place, and time. Coordination normal.  Skin: Skin is warm and dry. No rash noted.  Psychiatric: Natasha [name] has a normal mood and affect. Natasha [name] behavior is normal.      Assessment & Plan:   Natasha [name] was seen today for cough, nasal congestion and shortness of breath.  Diagnoses and all orders for this visit:  Acute bronchitis, unspecified organism  Acute pansinusitis, recurrence not specified  Other orders -     amoxicillin-clavulanate (AUGMENTIN) 875-125 MG tablet; Take 1 tablet by mouth 2 (two) times daily. -     chlorpheniramine-HYDROcodone (TUSSIONEX PENNKINETIC ER) 10-8 MG/5ML SUER; Take 5 mLs by mouth 2 (two) times daily.  I am having Natasha [name] start on amoxicillin-clavulanate and chlorpheniramine-HYDROcodone. I am also having Natasha [name] maintain Natasha [name] levofloxacin, albuterol, sulfamethoxazole-trimethoprim, and cefUROXime.  Meds ordered this encounter  Medications  . amoxicillin-clavulanate (AUGMENTIN) 875-125 MG tablet    Sig: Take 1 tablet by mouth 2 (two) times daily.    Dispense:  20 tablet    Refill:  0  . chlorpheniramine-HYDROcodone (TUSSIONEX PENNKINETIC ER) 10-8 MG/5ML SUER    Sig: Take 5 mLs by mouth 2 (two) times daily.    Dispense:  60 mL    Refill:  0    Appropriate red flag conditions were discussed with the patient as well as actions that should be taken.  Patient expressed his understanding.  Follow-up: Return if symptoms worsen or fail to improve.  Roselee Culver, MD

## 2016-04-26 ENCOUNTER — Telehealth: Payer: Self-pay | Admitting: Behavioral Health

## 2016-04-26 NOTE — Telephone Encounter (Signed)
Unable to reach patient at time of Pre-Visit Call.  Left message for patient to return call when available.    

## 2016-04-27 ENCOUNTER — Encounter: Payer: Self-pay | Admitting: Family Medicine

## 2016-04-27 ENCOUNTER — Ambulatory Visit (INDEPENDENT_AMBULATORY_CARE_PROVIDER_SITE_OTHER): Payer: BLUE CROSS/BLUE SHIELD | Admitting: Family Medicine

## 2016-04-27 VITALS — BP 175/86 | HR 87 | Temp 98.4°F | Ht 64.0 in | Wt 258.6 lb

## 2016-04-27 DIAGNOSIS — Z1329 Encounter for screening for other suspected endocrine disorder: Secondary | ICD-10-CM | POA: Diagnosis not present

## 2016-04-27 DIAGNOSIS — Z131 Encounter for screening for diabetes mellitus: Secondary | ICD-10-CM | POA: Diagnosis not present

## 2016-04-27 DIAGNOSIS — Z13220 Encounter for screening for lipoid disorders: Secondary | ICD-10-CM | POA: Diagnosis not present

## 2016-04-27 DIAGNOSIS — Z119 Encounter for screening for infectious and parasitic diseases, unspecified: Secondary | ICD-10-CM

## 2016-04-27 DIAGNOSIS — E669 Obesity, unspecified: Secondary | ICD-10-CM | POA: Diagnosis not present

## 2016-04-27 DIAGNOSIS — R079 Chest pain, unspecified: Secondary | ICD-10-CM | POA: Insufficient documentation

## 2016-04-27 DIAGNOSIS — I10 Essential (primary) hypertension: Secondary | ICD-10-CM | POA: Diagnosis not present

## 2016-04-27 DIAGNOSIS — Z130 Encounter for screening for diseases of the blood and blood-forming organs and certain disorders involving the immune mechanism: Secondary | ICD-10-CM

## 2016-04-27 LAB — TROPONIN I

## 2016-04-27 MED ORDER — CARVEDILOL 12.5 MG PO TABS: 12.5000 mg | ORAL_TABLET | Freq: Two times a day (BID) | ORAL | Status: DC

## 2016-04-27 NOTE — Patient Instructions (Addendum)
It was very nice to see you today- I will be in touch with your labs asap Please start taking the blood pressure medication- coreg- and also start a baby aspirin daily We will have you see cardiology asap!    1:45 Friday afternoon, at [employer] center at [facility], Dr. Yvone Neu

## 2016-04-27 NOTE — Progress Notes (Signed)
Natasha [name] at [facility] [address] [place], [place] [address] [phone]  Date:  04/27/2016   Name:  Natasha [name]   DOB:  1961/01/09   MRN:  [id number]  PCP:  Lamar Blinks, MD    Chief Complaint: Establish Care   History of Present Illness:  Natasha [name] is a 55 y.o. very pleasant female patient who presents with the following:  Here today as a new patient to establish care and also to discuss some concerns about her BP and CP She has never had a PCP and wants to establish care with [place].   She had skin cancer in 3 different sites.  She also does have spinal stenosis and other back issues.  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  Average readings around 180/90.  Her mother and father are deceased but they both had HTN.  Her 2 brothers are in good health. She has 2 sons ages 6 and 69, they are doing very well.    Obese but at her current weight for about 10 years.    She has recently noted some chest pressure/ heaviness mostly with exertion but occasionally at rest.  When she walks she will feel very SOB and will feel like her heart is beating very hard.  She notes the symptoms consisently when she walks from the parking lot into work which she used to be able to do with ease  She has noted the chest heaviness for approx 3 months.  It is becoming more frequent.  She may have associated sweating Last episode was 2 days ago No current CP  Never had a stress test.   Never dx with CAD She is a never smoker Post- menopausal  There are no active problems to display for this patient.   Past Medical History  Diagnosis Date  . Allergy   .  Anemia   . Spinal stenosis   . Cancer [facility])     Past Surgical History  Procedure Laterality Date  . Cesarean section      Social History  Substance Use Topics  . Smoking status: Never Smoker   . Smokeless tobacco: None  . Alcohol Use: No    Family History  Problem Relation Age of Onset  . Cancer Father     Allergies  Allergen Reactions  . Codeine Nausea Only    Medication list has been reviewed and updated.  No current outpatient prescriptions on file prior to visit.   No current facility-administered medications on file prior to visit.    Review of Systems:  As per HPI- otherwise negative.   Physical Examination: Filed Vitals:   04/27/16 1419  BP: 160/91  Pulse: 87  Temp: 98.4 F (36.9 C)   Filed Vitals:   04/27/16 1419  Height: 5\' 4"  (1.626 m)  Weight: 258 lb 9.6 oz (117.3 kg)   Body mass index is 44.37 kg/(m^2). Ideal Body Weight: Weight in (lb) to have BMI = 25: 145.3  GEN: WDWN, NAD, Non-toxic, A & O x 3, obese, looks well HEENT: Atraumatic, Normocephalic. Neck supple. No  masses, No LAD.  Bilateral TM wnl, oropharynx normal.  PEERL,EOMI.   Ears and Nose: No external deformity. CV: RRR, No M/G/R. No JVD. No thrill. No extra heart sounds. PULM: CTA B, no wheezes, crackles, rhonchi. No retractions. No resp. distress. No accessory muscle use. ABD: S, NT, ND, +BS. No rebound. No HSM. EXTR: No c/c/e NEURO Normal gait.  PSYCH: Normally interactive. Conversant. Not depressed or anxious appearing.  Calm demeanor.   EKG:  NSR with rate of 73, no ST elevation or depression Assessment and Plan: Chest pain, unspecified chest pain type - Plan: EKG 12-Lead, carvedilol (COREG) 12.5 MG tablet, Troponin I, CANCELED: Troponin I  Screening examination for infectious disease - Plan: Hepatitis C antibody  Screening for diabetes mellitus - Plan: Comprehensive metabolic panel, Hemoglobin A1c  Screening for hyperlipidemia - Plan: Lipid panel  Screening for  deficiency anemia - Plan: CBC  Screening for thyroid disorder - Plan: TSH  Essential hypertension  Obesity  Here today as a new patient with HTN and CP which is concerning for angina.  No active chest pain.  Will check a stat troponin for her as well as labs above Start on coreg Start on aspirin Made a cardiology appt for this Friday Cautioned her to seek care if she has any worse or different CP between now and her cardiology appt   Signed Lamar Blinks, MD

## 2016-04-27 NOTE — Progress Notes (Signed)
Pre visit review using our clinic review tool, if applicable. No additional management support is needed unless otherwise documented below in the visit note. 

## 2016-04-28 LAB — LIPID PANEL
CHOLESTEROL: 201 mg/dL — AB (ref 0–200)
HDL: 42.8 mg/dL (ref >39.00)
LDL Cholesterol: 141 mg/dL — ABNORMAL HIGH (ref 0–99)
NonHDL: 158.43
TRIGLYCERIDES: 86 mg/dL (ref 0.0–149.0)
Total CHOL/HDL Ratio: 5
VLDL: 17.2 mg/dL (ref 0.0–40.0)

## 2016-04-28 LAB — CBC
HCT: 42.3 % (ref 36.0–46.0)
HEMOGLOBIN: 14 g/dL (ref 12.0–15.0)
MCHC: 33 g/dL (ref 30.0–36.0)
MCV: 82.8 fl (ref 78.0–100.0)
PLATELETS: 176 10*3/uL (ref 150.0–400.0)
RBC: 5.11 Mil/uL (ref 3.87–5.11)
RDW: 13.8 % (ref 11.5–15.5)
WBC: 7.5 10*3/uL (ref 4.0–10.5)

## 2016-04-28 LAB — COMPREHENSIVE METABOLIC PANEL
ALK PHOS: 79 U/L (ref 39–117)
ALT: 32 U/L (ref 0–35)
AST: 25 U/L (ref 0–37)
Albumin: 4.4 g/dL (ref 3.5–5.2)
BUN: 12 mg/dL (ref 6–23)
CO2: 28 mEq/L (ref 19–32)
Calcium: 9.6 mg/dL (ref 8.4–10.5)
Chloride: 103 mEq/L (ref 96–112)
Creatinine, Ser: 0.65 mg/dL (ref 0.40–1.20)
GFR: 100.53 mL/min (ref >60.00)
Glucose, Bld: 85 mg/dL (ref 70–99)
POTASSIUM: 3.9 meq/L (ref 3.5–5.1)
Sodium: 139 mEq/L (ref 135–145)
TOTAL PROTEIN: 7.4 g/dL (ref 6.0–8.3)
Total Bilirubin: 0.6 mg/dL (ref 0.2–1.2)

## 2016-04-28 LAB — HEMOGLOBIN A1C: HEMOGLOBIN A1C: 5.7 % (ref 4.6–6.5)

## 2016-04-28 LAB — TSH: TSH: 2.64 u[IU]/mL (ref 0.35–4.50)

## 2016-04-28 LAB — HEPATITIS C ANTIBODY: HCV Ab: NEGATIVE

## 2016-04-29 ENCOUNTER — Encounter: Payer: Self-pay | Admitting: Cardiology

## 2016-04-29 ENCOUNTER — Other Ambulatory Visit: Payer: Self-pay | Admitting: Nurse Practitioner

## 2016-04-29 ENCOUNTER — Ambulatory Visit (INDEPENDENT_AMBULATORY_CARE_PROVIDER_SITE_OTHER): Payer: BLUE CROSS/BLUE SHIELD | Admitting: Cardiology

## 2016-04-29 VITALS — BP 152/88 | HR 60 | Ht 64.0 in | Wt 257.0 lb

## 2016-04-29 DIAGNOSIS — I10 Essential (primary) hypertension: Secondary | ICD-10-CM

## 2016-04-29 DIAGNOSIS — R0602 Shortness of breath: Secondary | ICD-10-CM

## 2016-04-29 DIAGNOSIS — R079 Chest pain, unspecified: Secondary | ICD-10-CM

## 2016-04-29 NOTE — Progress Notes (Signed)
Cardiology Office Note   Date:  04/29/2016   ID:  Natasha [name], DOB 1961/04/15, MRN [id number]  Referring Doctor:  Lamar Blinks, MD   Cardiologist:   Wende Bushy, MD   Reason for consultation:  Chief Complaint  Patient presents with  . other    Referred by Dr. Margaretmary Bayley for Chest pressure and SOB. Pt C/O of chest pressure. Medications reviewed verbally with patient.       History of Present Illness: Natasha [name] is a 55 y.o. female who presents for Chest pain and shortness of breath. Patient feels that symptoms have started probably a year ago but worse over the last 3-4 months. Pain is described as pressure in the center of the chest 3-4 out of 10 in severity, lasting minutes at a time, maximal 5 minutes, occurs with exertion. Chest pressure is nonradiating. This is accompanied by significant shortness of breath that has worsened over time.  Her PCP noted her blood pressure to be up lately and has been started on Coreg.  Patient denies fever, cough, colds, abdominal pain. No palpitations, syncope.   ROS:  Please see the history of present illness. Aside from mentioned under HPI, all other systems are reviewed and negative.     Past Medical History  Diagnosis Date  . Allergy   . Anemia   . Spinal stenosis   . Cancer [facility])     Past Surgical History  Procedure Laterality Date  . Cesarean section       reports that she has never smoked. She does not have any smokeless tobacco history on file. She reports that she does not drink alcohol or use illicit drugs.   family history includes Cancer in her father. Paternal uncles CAD in their 17s. Mother had CABG in her 49s.  Current Outpatient Prescriptions  Medication Sig Dispense Refill  . aspirin 81 MG tablet Take 81 mg by mouth daily.    . carvedilol (COREG) 12.5 MG tablet Take 1 tablet (12.5 mg total) by mouth 2 (two) times daily with a meal. 60 tablet 3   No current facility-administered medications for this visit.      Allergies: Codeine    PHYSICAL EXAM: VS:  BP 152/88 mmHg  Pulse 60  Ht 5\' 4"  (1.626 m)  Wt 257 lb (116.574 kg)  BMI 44.09 kg/m2  SpO2 98% , Body mass index is 44.09 kg/(m^2). Wt Readings from Last 3 Encounters:  04/29/16 257 lb (116.574 kg)  04/27/16 258 lb 9.6 oz (117.3 kg)  09/10/15 250 lb 12.8 oz (113.762 kg)    GENERAL:  well developed, well nourished, obese, not in acute distress HEENT: normocephalic, pink conjunctivae, anicteric sclerae, no xanthelasma, normal dentition, oropharynx clear NECK:  no neck vein engorgement, JVP normal, no hepatojugular reflux, carotid upstroke brisk and symmetric, no bruit, no thyromegaly, no lymphadenopathy LUNGS:  good respiratory effort, clear to auscultation bilaterally CV:  PMI not displaced, no thrills, no lifts, S1 and S2 within normal limits, no palpable S3 or S4, no murmurs, no rubs, no gallops ABD:  Soft, nontender, nondistended, normoactive bowel sounds, no abdominal aortic bruit, no hepatomegaly, no splenomegaly MS: nontender back, no kyphosis, no scoliosis, no joint deformities EXT:  2+ DP/PT pulses, no edema, no varicosities, no cyanosis, no clubbing SKIN: warm, nondiaphoretic, normal turgor, no ulcers NEUROPSYCH: alert, oriented to person, place, and time, sensory/motor grossly intact, normal mood, appropriate affect  Recent Labs: 04/27/2016: ALT 32; BUN 12; Creatinine, Ser 0.65; Hemoglobin 14.0; Platelets 176.0; Potassium  3.9; Sodium 139; TSH 2.64   Lipid Panel    Component Value Date/Time   CHOL 201* 04/27/2016 1547   TRIG 86.0 04/27/2016 1547   HDL 42.80 04/27/2016 1547   CHOLHDL 5 04/27/2016 1547   VLDL 17.2 04/27/2016 1547   LDLCALC 141* 04/27/2016 1547     Other studies Reviewed:  EKG:  The ekg from 04/29/2016 was personally reviewed by me and it revealed sinus rhythm, 60 BPM.  Additional studies/ records that were reviewed personally reviewed by me today include: None available   ASSESSMENT AND  PLAN:  Chest pain Shortness of breath Family history of CAD Risk factors for CAD include age, hypertension, obesity, family history. Recommend further evaluation with pharmacologic nuclear stress test as patient has spinal stenosis and is unable to walk on the treadmill. Recommend echocardiogram. Agree with aspirin and Coreg for now.  Hypertension Patient was recently started on Coreg. Blood pressure monitoring recommended.  Obesity Body mass index is 44.09 kg/(m^2).[name] Recommend aggressive weight loss through diet and increased physical activity. Once Cardec workup is done    Current medicines are reviewed at length with the patient today.  The patient does not have concerns regarding medicines.  Labs/ tests ordered today include: Orders Placed This Encounter  Procedures  . Myocardial Perfusion Imaging  . EKG 12-Lead  . ECHOCARDIOGRAM COMPLETE    I had a lengthy and detailed discussion with the patient regarding diagnoses, prognosis, diagnostic options, treatment options..   I counseled the patient on importance of lifestyle modification including heart healthy diet, regular physical activity .  Disposition:   FU with undersigned after tests   Signed, Wende Bushy, MD  04/29/2016 2:45 PM    [facility]

## 2016-04-29 NOTE — Patient Instructions (Addendum)
Medication Instructions:  Your physician recommends that you continue on your current medications as directed. Please refer to the Current Medication list given to you today.   Labwork: None ordered  Testing/Procedures: Your physician has requested that you have an echocardiogram. Echocardiography is a painless test that uses sound waves to create images of your heart. It provides your doctor with information about the size and shape of your heart and how well your heart's chambers and valves are working. This procedure takes approximately one hour. There are no restrictions for this procedure.  Date & Time:_________________________________________________________________  MYOVIEW  Your caregiver has ordered a Stress Test with nuclear imaging. The purpose of this test is to evaluate the blood supply to your heart muscle. This procedure is referred to as a "Non-Invasive Stress Test." This is because other than having an IV started in your vein, nothing is inserted or "invades" your body. Cardiac stress tests are done to find areas of poor blood flow to the heart by determining the extent of coronary artery disease (CAD). Some patients exercise on a treadmill, which naturally increases the blood flow to your heart, while others who are  unable to walk on a treadmill due to physical limitations have a pharmacologic/chemical stress agent called Lexiscan . This medicine will mimic walking on a treadmill by temporarily increasing your coronary blood flow.   Please note: these test may take anywhere between 2-4 hours to complete  PLEASE REPORT TO [facility] at [address]  Date of Procedure:_Monday June 19th at 12:30 pm & Tuesday June 20th at 12:45AM___  Arrival Time for Procedure:___Arrive 15 minutes prior to check in________  Instructions regarding medication:   __X__:  Hold carvedilol the night before procedure and morning of procedure    PLEASE NOTIFY THE  OFFICE AT LEAST 24 HOURS IN ADVANCE IF YOU ARE UNABLE TO KEEP YOUR APPOINTMENT.  [phone] AND  PLEASE NOTIFY [place] [facility] [place] IF YOU ARE UNABLE TO KEEP YOUR APPOINTMENT. [phone]  How to prepare for your Myoview test:  1. Do not eat or drink after midnight 2. No caffeine for 24 hours prior to test 3. No smoking 24 hours prior to test. 4. Your medication may be taken with water.  If your doctor stopped a medication because of this test, do not take that medication. 5. Ladies, please do not wear dresses.  Skirts or pants are appropriate. Please wear a short sleeve shirt. 6. No perfume, cologne or lotion. 7. Wear comfortable walking shoes. No heels!  Follow-Up: Your physician recommends that you schedule a follow-up appointment after testing to review results.  Date & Time: _______________________________________________________________   Any Other Special Instructions Will Be Listed Below (If Applicable).     If you need a refill on your cardiac medications before your next appointment, please call your pharmacy.  Echocardiogram An echocardiogram, or echocardiography, uses sound waves (ultrasound) to produce an image of your heart. The echocardiogram is simple, painless, obtained within a short period of time, and offers valuable information to your health care provider. The images from an echocardiogram can provide information such as:  Evidence of coronary artery disease (CAD).  Heart size.  Heart muscle function.  Heart valve function.  Aneurysm detection.  Evidence of a past heart attack.  Fluid buildup around the heart.  Heart muscle thickening.  Assess heart valve function. LET [facility] CARE PROVIDER KNOW ABOUT:  Any allergies you have.  All medicines  you are taking, including vitamins, herbs, eye drops, creams, and over-the-counter medicines.  Previous problems you or members of your family have had with the  use of anesthetics.  Any blood disorders you have.  Previous surgeries you have had.  Medical conditions you have.  Possibility of pregnancy, if this applies. BEFORE THE PROCEDURE  No special preparation is needed. Eat and drink normally.  PROCEDURE  8. In order to produce an image of your heart, gel will be applied to your chest and a wand-like tool (transducer) will be moved over your chest. The gel will help transmit the sound waves from the transducer. The sound waves will harmlessly bounce off your heart to allow the heart images to be captured in real-time motion. These images will then be recorded. 9. You may need an IV to receive a medicine that improves the quality of the pictures. AFTER THE PROCEDURE You may return to your normal schedule including diet, activities, and medicines, unless your health care provider tells you otherwise.   This information is not intended to replace advice given to you by your health care provider. Make sure you discuss any questions you have with your health care provider.   Document Released: 11/04/2000 Document Revised: 11/28/2014 Document Reviewed: 07/15/2013 Elsevier Interactive Patient Education 2016 Elsevier Inc.    Pharmacologic Stress Electrocardiogram A pharmacologic stress electrocardiogram is a heart (cardiac) test that uses nuclear imaging to evaluate the blood supply to your heart. This test may also be called a pharmacologic stress electrocardiography. Pharmacologic means that a medicine is used to increase your heart rate and blood pressure.  This stress test is done to find areas of poor blood flow to the heart by determining the extent of coronary artery disease (CAD). Some people exercise on a treadmill, which naturally increases the blood flow to the heart. For those people unable to exercise on a treadmill, a medicine is used. This medicine stimulates your heart and will cause your heart to beat harder and more quickly, as if you  were exercising.  Pharmacologic stress tests can help determine:  The adequacy of blood flow to your heart during increased levels of activity in order to clear you for discharge home.  The extent of coronary artery blockage caused by CAD.  Your prognosis if you have suffered a heart attack.  The effectiveness of cardiac procedures done, such as an angioplasty, which can increase the circulation in your coronary arteries.  Causes of chest pain or pressure. [facility] KNOW ABOUT:  Any allergies you have.  All medicines you are taking, including vitamins, herbs, eye drops, creams, and over-the-counter medicines.  Previous problems you or members of your family have had with the use of anesthetics.  Any blood disorders you have.  Previous surgeries you have had.  Medical conditions you have.  Possibility of pregnancy, if this applies.  If you are currently breastfeeding. RISKS AND COMPLICATIONS Generally, this is a safe procedure. However, as with any procedure, complications can occur. Possible complications include: 10. You develop pain or pressure in the following areas: 1. Chest. 2. Jaw or neck. 3. Between your shoulder blades. 4. Radiating down your left arm. 11. Headache. 12. Dizziness or light-headedness. 13. Shortness of breath. 14. Increased or irregular heartbeat. 15. Low blood pressure. 16. Nausea or vomiting. 17. Flushing. 18. Redness going up the arm and slight pain during injection of medicine. 19. Heart attack (rare). BEFORE THE PROCEDURE   Avoid all forms of caffeine for 24  hours before your test or as directed by your health care provider. This includes coffee, tea (even decaffeinated tea), caffeinated sodas, chocolate, cocoa, and certain pain medicines.  Follow your health care provider's instructions regarding eating and drinking before the test.  Take your medicines as directed at regular times with water unless instructed  otherwise. Exceptions may include:  If you have diabetes, ask how you are to take your insulin or pills. It is common to adjust insulin dosing the morning of the test.  If you are taking beta-blocker medicines, it is important to talk to your health care provider about these medicines well before the date of your test. Taking beta-blocker medicines may interfere with the test. In some cases, these medicines need to be changed or stopped 24 hours or more before the test.  If you wear a nitroglycerin patch, it may need to be removed prior to the test. Ask your health care provider if the patch should be removed before the test.  If you use an inhaler for any breathing condition, bring it with you to the test.  If you are an outpatient, bring a snack so you can eat right after the stress phase of the test.  Do not smoke for 4 hours prior to the test or as directed by your health care provider.  Do not apply lotions, powders, creams, or oils on your chest prior to the test.  Wear comfortable shoes and clothing. Let your health care provider know if you were unable to complete or follow the preparations for your test. PROCEDURE   Multiple patches (electrodes) will be put on your chest. If needed, small areas of your chest may be shaved to get better contact with the electrodes. Once the electrodes are attached to your body, multiple wires will be attached to the electrodes, and your heart rate will be monitored.  An IV access will be started. A nuclear trace (isotope) is given. The isotope may be given intravenously, or it may be swallowed. Nuclear refers to several types of radioactive isotopes, and the nuclear isotope lights up the arteries so that the nuclear images are clear. The isotope is absorbed by your body. This results in low radiation exposure.  A resting nuclear image is taken to show how your heart functions at rest.  A medicine is given through the IV access.  A second scan is  done about 1 hour after the medicine injection and determines how your heart functions under stress.  During this stress phase, you will be connected to an electrocardiogram machine. Your blood pressure and oxygen levels will be monitored. AFTER THE PROCEDURE   Your heart rate and blood pressure will be monitored after the test.  You may return to your normal schedule, including diet,activities, and medicines, unless your health care provider tells you otherwise.   This information is not intended to replace advice given to you by your health care provider. Make sure you discuss any questions you have with your health care provider.   Document Released: 03/26/2009 Document Revised: 11/12/2013 Document Reviewed: 07/15/2013 Elsevier Interactive Patient Education [employer].

## 2016-05-03 ENCOUNTER — Telehealth (HOSPITAL_COMMUNITY): Payer: Self-pay | Admitting: *Deleted

## 2016-05-03 NOTE — Telephone Encounter (Signed)
Left message on voicemail per DPR in reference to upcoming appointment scheduled on 05/09/16 at 1230 with detailed instructions given per Myocardial Perfusion Study Information Sheet for the test. LM to arrive 15 minutes early, and that it is imperative to arrive on time for appointment to keep from having the test rescheduled. If you need to cancel or reschedule your appointment, please call the office within 24 hours of your appointment. Failure to do so may result in a cancellation of your appointment, and a $50 no show fee. Phone number given for call back for any questions. Esau Fridman, Ranae Palms

## 2016-05-09 ENCOUNTER — Ambulatory Visit (HOSPITAL_COMMUNITY): Payer: BLUE CROSS/BLUE SHIELD | Attending: Internal Medicine

## 2016-05-09 DIAGNOSIS — I10 Essential (primary) hypertension: Secondary | ICD-10-CM | POA: Insufficient documentation

## 2016-05-09 DIAGNOSIS — R0602 Shortness of breath: Secondary | ICD-10-CM | POA: Diagnosis not present

## 2016-05-09 DIAGNOSIS — R079 Chest pain, unspecified: Secondary | ICD-10-CM | POA: Insufficient documentation

## 2016-05-09 DIAGNOSIS — Z8249 Family history of ischemic heart disease and other diseases of the circulatory system: Secondary | ICD-10-CM | POA: Diagnosis not present

## 2016-05-09 MED ORDER — REGADENOSON 0.4 MG/5ML IV SOLN
0.4000 mg | Freq: Once | INTRAVENOUS | Status: AC
  Administered 2016-05-09: 0.4 mg via INTRAVENOUS

## 2016-05-09 MED ORDER — TECHNETIUM TC 99M TETROFOSMIN IV KIT
32.4000 | PACK | Freq: Once | INTRAVENOUS | Status: AC | PRN
  Administered 2016-05-09: 32 via INTRAVENOUS
  Filled 2016-05-09: qty 32

## 2016-05-10 ENCOUNTER — Ambulatory Visit (HOSPITAL_COMMUNITY): Payer: BLUE CROSS/BLUE SHIELD | Attending: Cardiology

## 2016-05-10 MED ORDER — TECHNETIUM TC 99M TETROFOSMIN IV KIT
28.7000 | PACK | Freq: Once | INTRAVENOUS | Status: AC | PRN
  Administered 2016-05-10: 29 via INTRAVENOUS
  Filled 2016-05-10: qty 29

## 2016-05-11 ENCOUNTER — Ambulatory Visit (INDEPENDENT_AMBULATORY_CARE_PROVIDER_SITE_OTHER): Payer: BLUE CROSS/BLUE SHIELD

## 2016-05-11 ENCOUNTER — Other Ambulatory Visit: Payer: Self-pay

## 2016-05-11 DIAGNOSIS — R079 Chest pain, unspecified: Secondary | ICD-10-CM

## 2016-05-11 LAB — ECHOCARDIOGRAM COMPLETE
CHL CUP DOP CALC LVOT VTI: 26.3 cm
CHL CUP MV DEC (S): 246
E decel time: 246 msec
E/e' ratio: 10.89
FS: 46 % — AB (ref 28–44)
IV/PV OW: 0.74
LA diam index: 1.44 cm/m2
LA vol A4C: 47.5 ml
LA vol: 59.7 mL
LASIZE: 34 mm
LAVOLIN: 25.3 mL/m2
LEFT ATRIUM END SYS DIAM: 34 mm
LV TDI E'MEDIAL: 8.59
LV dias vol index: 38 mL/m2
LV sys vol: 38 mL (ref 14–42)
LVDIAVOL: 90 mL (ref 46–106)
LVEEAVG: 10.89
LVEEMED: 10.89
LVELAT: 9.46 cm/s
LVOT area: 2.54 cm2
LVOTD: 18 mm
LVOTPV: 106 cm/s
LVOTSV: 67 mL
LVSYSVOLIN: 16 mL/m2
MV pk A vel: 114 m/s
MVPG: 4 mmHg
MVPKEVEL: 103 m/s
PW: 10.3 mm — AB (ref 0.6–1.1)
Simpson's disk: 58
Stroke v: 53 ml
TAPSE: 26.4 mm
TDI e' lateral: 9.46

## 2016-05-11 LAB — MYOCARDIAL PERFUSION IMAGING
CHL CUP NUCLEAR SDS: 3
CHL CUP NUCLEAR SRS: 7
CHL CUP NUCLEAR SSS: 6
CSEPPHR: 92 {beats}/min
LHR: 0.38
LV dias vol: 85 mL (ref 46–106)
LV sys vol: 25 mL
Rest HR: 65 {beats}/min
TID: 0.95

## 2016-05-18 ENCOUNTER — Ambulatory Visit: Payer: BLUE CROSS/BLUE SHIELD | Admitting: Cardiovascular Disease

## 2016-05-25 ENCOUNTER — Encounter: Payer: Self-pay | Admitting: Cardiology

## 2016-05-25 ENCOUNTER — Ambulatory Visit (INDEPENDENT_AMBULATORY_CARE_PROVIDER_SITE_OTHER): Payer: BLUE CROSS/BLUE SHIELD | Admitting: Cardiology

## 2016-05-25 VITALS — BP 140/80 | HR 66 | Ht 64.5 in | Wt 255.0 lb

## 2016-05-25 DIAGNOSIS — I10 Essential (primary) hypertension: Secondary | ICD-10-CM

## 2016-05-25 DIAGNOSIS — R0602 Shortness of breath: Secondary | ICD-10-CM

## 2016-05-25 DIAGNOSIS — E669 Obesity, unspecified: Secondary | ICD-10-CM | POA: Diagnosis not present

## 2016-05-25 NOTE — Progress Notes (Signed)
Cardiology Office Note   Date:  05/25/2016   ID:  Natasha [name], DOB 1961/11/12, MRN [id number]  Referring Doctor:  Lamar Blinks, MD   Cardiologist:   Wende Bushy, MD   Reason for consultation:  Chief Complaint  Patient presents with  . other    Follow up from [facility] and echo. Meds reviewed by the patient verbally. "doing well."      History of Present Illness: Natasha [name] is a 55 y.o. female who presents for  Follow up after testing. Patient denies recurrence of chest pain. She continues to have mild shortness of breath.  In terms of hypertension, blood pressure has improved with carvedilol.  Patient denies fever, cough, colds, abdominal pain. No palpitations, syncope.   ROS:  Please see the history of present illness. Aside from mentioned under HPI, all other systems are reviewed and negative.     Past Medical History  Diagnosis Date  . Allergy   . Anemia   . Spinal stenosis   . Cancer [facility])     Past Surgical History  Procedure Laterality Date  . Cesarean section       reports that she has never smoked. She does not have any smokeless tobacco history on file. She reports that she does not drink alcohol or use illicit drugs.   family history includes Cancer in her father. Paternal uncles CAD in their 6s. Mother had CABG in her 52s.  Current Outpatient Prescriptions  Medication Sig Dispense Refill  . aspirin 81 MG tablet Take 81 mg by mouth daily.    . carvedilol (COREG) 12.5 MG tablet Take 1 tablet (12.5 mg total) by mouth 2 (two) times daily with a meal. 60 tablet 3   No current facility-administered medications for this visit.    Allergies: Codeine    PHYSICAL EXAM: VS:  BP 140/80 mmHg  Pulse 66  Ht 5' 4.5" (1.638 m)  Wt 255 lb (115.667 kg)  BMI 43.11 kg/m2 , Body mass index is 43.11 kg/(m^2). Wt Readings from Last 3 Encounters:  05/25/16 255 lb (115.667 kg)  04/29/16 257 lb (116.574 kg)  04/27/16 258 lb 9.6 oz (117.3 kg)    GENERAL:   well developed, well nourished, obese, not in acute distress HEENT: normocephalic, pink conjunctivae, anicteric sclerae, no xanthelasma, normal dentition, oropharynx clear NECK:  no neck vein engorgement, JVP normal, no hepatojugular reflux, carotid upstroke brisk and symmetric, no bruit, no thyromegaly, no lymphadenopathy LUNGS:  good respiratory effort, clear to auscultation bilaterally CV:  PMI not displaced, no thrills, no lifts, S1 and S2 within normal limits, no palpable S3 or S4, no murmurs, no rubs, no gallops ABD:  Soft, nontender, nondistended, normoactive bowel sounds, no abdominal aortic bruit, no hepatomegaly, no splenomegaly MS: nontender back, no kyphosis, no scoliosis, no joint deformities EXT:  2+ DP/PT pulses, no edema, no varicosities, no cyanosis, no clubbing SKIN: warm, nondiaphoretic, normal turgor, no ulcers NEUROPSYCH: alert, oriented to person, place, and time, sensory/motor grossly intact, normal mood, appropriate affect  Recent Labs: 04/27/2016: ALT 32; BUN 12; Creatinine, Ser 0.65; Hemoglobin 14.0; Platelets 176.0; Potassium 3.9; Sodium 139; TSH 2.64   Lipid Panel    Component Value Date/Time   CHOL 201* 04/27/2016 1547   TRIG 86.0 04/27/2016 1547   HDL 42.80 04/27/2016 1547   CHOLHDL 5 04/27/2016 1547   VLDL 17.2 04/27/2016 1547   LDLCALC 141* 04/27/2016 1547     Other studies Reviewed:  EKG:  The ekg from 04/29/2016 was personally  reviewed by me and it revealed sinus rhythm, 60 BPM.  Additional studies/ records that were reviewed personally reviewed by me today include:  echo 05/11/2016: Left ventricle: The cavity size was normal. Wall thickness was  normal. Systolic function was normal. The estimated ejection  fraction was in the range of 60% to 65%. Wall motion was normal;  there were no regional wall motion abnormalities. Doppler  parameters are consistent with abnormal left ventricular  relaxation (grade 1 diastolic dysfunction). - Pulmonary  arteries: PA peak pressure: 35 mm Hg (S).  Nuclear stress test 05/10/2016:  Nuclear stress EF: 70%.  There was no ST segment deviation noted during stress.  Defect 1: There is a small defect of moderate severity present in the apex location. This is likely artifact due to apical thinning. However, cannot rule out a small area of ischemia. Given that there is normal wall motion, this is unlikely.  This is a low risk study.  The left ventricular ejection fraction is hyperdynamic (>65%).  ASSESSMENT AND PLAN:  Chest pain Shortness of breath Family history of CAD Risk factors for CAD include age, hypertension, obesity, family history. Low risk nuclear stress test. Results discussed at length with patient. Likelihood of clinically significant CAD is low. LVEF is within normal limits. No evidence of congestive heart failure on examination. Discussed possibility of proceeding with calcium scoring for further risk stratification. Patient to think about this. Should consider follow-up with PCP for pulmonary evaluation/chest x-ray/pulmonary function testing. If pulmonary evaluation is negative, and symptoms progress, recommend follow-up with cardiology to determine need for further ischemia evaluation/invasive testing.  Hypertension Patient was recently started on Coreg. BP is well controlled. Continue monitoring BP. Continue current medical therapy and lifestyle changes.   Obesity Body mass index is 43.11 kg/(m^2).[name] Recommend aggressive weight loss through diet and increased physical activity.     Current medicines are reviewed at length with the patient today.  The patient does not have concerns regarding medicines.  Labs/ tests ordered today include: No orders of the defined types were placed in this encounter.    I had a lengthy and detailed discussion with the patient regarding diagnoses, prognosis, diagnostic options, treatment options..   I counseled the patient on importance  of lifestyle modification including heart healthy diet, regular physical activity .  Disposition:   FU with undersigned prn   Signed, Wende Bushy, MD  05/25/2016 1:23 PM    [facility]

## 2016-05-25 NOTE — Patient Instructions (Signed)
Medication Instructions:  Your physician recommends that you continue on your current medications as directed. Please refer to the Current Medication list given to you today.   Labwork: None ordered  Testing/Procedures: None ordered  Follow-Up: Your physician recommends that you schedule a follow-up appointment as needed.  It was a pleasure seeing you today here in the office. Please do not hesitate to give us a call back if you have any further questions. [phone]  Loan Oguin A. RN, BSN      Any Other Special Instructions Will Be Listed Below (If Applicable).     If you need a refill on your cardiac medications before your next appointment, please call your pharmacy.   

## 2016-08-29 ENCOUNTER — Other Ambulatory Visit: Payer: Self-pay | Admitting: Family Medicine

## 2016-08-29 DIAGNOSIS — R079 Chest pain, unspecified: Secondary | ICD-10-CM

## 2016-09-29 ENCOUNTER — Telehealth: Payer: Self-pay | Admitting: Family Medicine

## 2016-09-29 NOTE — Telephone Encounter (Signed)
Saw her husband Jeneen Rinks as a pt today.  He confided in me that Salley has a very hard time getting into work due to her back trouble- he often needs to drive her and drop her off at the door.  A HD placard would be much appreciated.  Filled out for her today

## 2016-10-24 ENCOUNTER — Other Ambulatory Visit: Payer: Self-pay | Admitting: Family Medicine

## 2016-10-24 DIAGNOSIS — R079 Chest pain, unspecified: Secondary | ICD-10-CM

## 2017-01-19 ENCOUNTER — Other Ambulatory Visit: Payer: Self-pay | Admitting: Family Medicine

## 2017-01-19 DIAGNOSIS — R079 Chest pain, unspecified: Secondary | ICD-10-CM

## 2017-04-25 ENCOUNTER — Ambulatory Visit (INDEPENDENT_AMBULATORY_CARE_PROVIDER_SITE_OTHER): Payer: BLUE CROSS/BLUE SHIELD | Admitting: Sports Medicine

## 2017-04-25 ENCOUNTER — Encounter: Payer: Self-pay | Admitting: Sports Medicine

## 2017-04-25 DIAGNOSIS — B359 Dermatophytosis, unspecified: Secondary | ICD-10-CM | POA: Diagnosis not present

## 2017-04-25 DIAGNOSIS — B351 Tinea unguium: Secondary | ICD-10-CM | POA: Diagnosis not present

## 2017-04-25 DIAGNOSIS — M79674 Pain in right toe(s): Secondary | ICD-10-CM

## 2017-04-25 DIAGNOSIS — M79675 Pain in left toe(s): Secondary | ICD-10-CM | POA: Diagnosis not present

## 2017-04-25 MED ORDER — KETOCONAZOLE 2 % EX CREA: 1.0000 "application " | TOPICAL_CREAM | Freq: Every day | CUTANEOUS | 5 refills | Status: DC

## 2017-04-25 MED ORDER — CLOTRIMAZOLE 1 % EX SOLN: 1.0000 "application " | Freq: Two times a day (BID) | CUTANEOUS | 5 refills | Status: DC

## 2017-04-25 NOTE — Progress Notes (Signed)
  Subjective: Natasha [name] is a 56 y.o. female patient seen today in office with complaint of painful thickened and discolored nails L>R with skin changes. Patient is desiring treatment for nail and skin changes; has tried OTC topicals/Medication in the past with no improvement. Reports that nails are becoming difficult to manage because of the thickness. Patient has no other pedal complaints at this time.   Patient Active Problem List   Diagnosis Date Noted  . Obesity 04/27/2016  . Pain in the chest 04/27/2016    Current Outpatient Prescriptions on File Prior to Visit  Medication Sig Dispense Refill  . aspirin 81 MG tablet Take 81 mg by mouth daily.    . carvedilol (COREG) 12.5 MG tablet TAKE 1 TABLET(12.5 MG) BY MOUTH TWICE DAILY WITH A MEAL 60 tablet 0   No current facility-administered medications on file prior to visit.     Allergies  Allergen Reactions  . Codeine Nausea Only    Objective: Physical Exam  General: Well developed, nourished, no acute distress, awake, alert and oriented x 3  Vascular: Dorsalis pedis artery 2/4 bilateral, Posterior tibial artery 2/4 bilateral, skin temperature warm to warm proximal to distal bilateral lower extremities, no varicosities, pedal hair present bilateral.  Neurological: Gross sensation present via light touch bilateral.   Dermatological: Skin is warm, dry, and supple bilateral, Nails 1-10 are tender, short thick, and discolored with mild subungal debris L>R , no webspace macerations present bilateral, no open lesions present bilateral, no callus/corns/hyperkeratotic tissue present bilateral. + scaly skin in annular fashion L>R foot consistent with tinea. No other signs of infection bilateral.  Musculoskeletal: No boney deformities noted bilateral. Muscular strength within normal limits without painon range of motion. No pain with calf compression bilateral.  Assessment and Plan:  Problem List Items Addressed This Visit    None     Visit Diagnoses    Dermatophytosis of nail    -  Primary   Relevant Medications   ketoconazole (NIZORAL) 2 % cream   clotrimazole (LOTRIMIN) 1 % external solution   Other Relevant Orders   Fungus culture w smear   Nail fungus       Relevant Medications   ketoconazole (NIZORAL) 2 % cream   clotrimazole (LOTRIMIN) 1 % external solution   Toe pain, bilateral       Tinea       Relevant Medications   ketoconazole (NIZORAL) 2 % cream   clotrimazole (LOTRIMIN) 1 % external solution      -Examined patient -Discussed treatment options for painful dystrophic nails and tinea -Rx clotrimazole and ketoconazole cream -Recommend good hygiene habits -Fungal culture was obtained from left 1-5 toenails and sent to [facility] lab -Patient to return in 4 weeks for follow up evaluation and discussion of fungal culture results or sooner if symptoms worsen.  Landis Martins, DPM

## 2017-05-23 ENCOUNTER — Ambulatory Visit (INDEPENDENT_AMBULATORY_CARE_PROVIDER_SITE_OTHER): Payer: BLUE CROSS/BLUE SHIELD | Admitting: Sports Medicine

## 2017-05-23 DIAGNOSIS — M79675 Pain in left toe(s): Secondary | ICD-10-CM

## 2017-05-23 DIAGNOSIS — B351 Tinea unguium: Secondary | ICD-10-CM | POA: Diagnosis not present

## 2017-05-23 DIAGNOSIS — M79674 Pain in right toe(s): Secondary | ICD-10-CM

## 2017-05-23 NOTE — Progress Notes (Signed)
Subjective: Natasha [name] is a 56 y.o. female patient seen today in office for fungal culture results. Patient has no other pedal complaints at this time.   Patient Active Problem List   Diagnosis Date Noted  . Obesity 04/27/2016  . Pain in the chest 04/27/2016    Current Outpatient Prescriptions on File Prior to Visit  Medication Sig Dispense Refill  . aspirin 81 MG tablet Take 81 mg by mouth daily.    . carvedilol (COREG) 12.5 MG tablet TAKE 1 TABLET(12.5 MG) BY MOUTH TWICE DAILY WITH A MEAL 60 tablet 0  . clotrimazole (LOTRIMIN) 1 % external solution Apply 1 application topically 2 (two) times daily. In between toes and nails 60 mL 5  . ketoconazole (NIZORAL) 2 % cream Apply 1 application topically daily. 30 g 5   No current facility-administered medications on file prior to visit.     Allergies  Allergen Reactions  . Codeine Nausea Only    Objective: Physical Exam  General: Well developed, nourished, no acute distress, awake, alert and oriented x 3  Vascular: Dorsalis pedis artery 2/4 bilateral, Posterior tibial artery 2/4 bilateral, skin temperature warm to warm proximal to distal bilateral lower extremities, no varicosities, pedal hair present bilateral.  Neurological: Gross sensation present via light touch bilateral.   Dermatological: Skin is warm, dry, and supple bilateral, Nails 1-10 are tender, short thick, and discolored with mild subungal debris L>R , no webspace macerations present bilateral, no open lesions present bilateral, no callus/corns/hyperkeratotic tissue present bilateral. + scaly skin in annular fashion L>R foot consistent with tinea, improving. No other signs of infection bilateral.  Musculoskeletal: No boney deformities noted bilateral. Muscular strength within normal limits without painon range of motion. No pain with calf compression bilateral.  Fungal culture T Rubrum  Assessment and Plan:  Problem List Items Addressed This Visit    None     Visit Diagnoses    Nail fungus    -  Primary   Toe pain, bilateral         -Examined patient -Continue with topcials for tinea pedis as previously Rx -Discussed treatment options for painful mycotic nails -Patient opt for laser -Advised good hygiene habits -Patient to return for laser with Ernestine Conrad for 1st treatment next month or sooner if symptoms worsen.  Landis Martins, DPM

## 2017-05-30 ENCOUNTER — Ambulatory Visit: Payer: BLUE CROSS/BLUE SHIELD

## 2017-05-30 DIAGNOSIS — B351 Tinea unguium: Secondary | ICD-10-CM

## 2017-05-30 NOTE — Progress Notes (Signed)
Pt presents with mycotic infection of nails 1-5 bilateral  All other systems are negative  Laser therapy administered to affected nails and tolerated well. All safety precautions were in place. RE-appointed in 4 weeks for 2nd treatment 

## 2017-07-03 ENCOUNTER — Other Ambulatory Visit: Payer: BLUE CROSS/BLUE SHIELD

## 2017-07-17 ENCOUNTER — Ambulatory Visit: Payer: Self-pay | Admitting: Podiatry

## 2017-07-17 DIAGNOSIS — B351 Tinea unguium: Secondary | ICD-10-CM

## 2017-07-18 NOTE — Progress Notes (Signed)
Pt presents with mycotic infection of nails 1-5 bilateral  All other systems are negative  Laser therapy administered to affected nails and tolerated well. All safety precautions were in place. RE-appointed in 4 weeks for 3rd treatment 

## 2017-08-29 ENCOUNTER — Ambulatory Visit (INDEPENDENT_AMBULATORY_CARE_PROVIDER_SITE_OTHER): Payer: BLUE CROSS/BLUE SHIELD | Admitting: Sports Medicine

## 2017-08-29 ENCOUNTER — Ambulatory Visit: Payer: BLUE CROSS/BLUE SHIELD | Admitting: Sports Medicine

## 2017-08-29 DIAGNOSIS — M79675 Pain in left toe(s): Secondary | ICD-10-CM

## 2017-08-29 DIAGNOSIS — B359 Dermatophytosis, unspecified: Secondary | ICD-10-CM

## 2017-08-29 DIAGNOSIS — B351 Tinea unguium: Secondary | ICD-10-CM

## 2017-08-29 DIAGNOSIS — M79674 Pain in right toe(s): Secondary | ICD-10-CM

## 2017-08-29 MED ORDER — TERBINAFINE HCL 1 % EX SOLN: CUTANEOUS | 2 refills | Status: DC

## 2017-08-29 NOTE — Progress Notes (Signed)
Subjective: Natasha [name] is a 56 y.o. female patient seen today in office for f/u on tinea on left foot states that the cream that I gave her last time made her skin look raw. Patient has had laser nail treatment today. Patient has no other pedal complaints at this time.   Patient Active Problem List   Diagnosis Date Noted  . Obesity 04/27/2016  . Pain in the chest 04/27/2016    Current Outpatient Prescriptions on File Prior to Visit  Medication Sig Dispense Refill  . aspirin 81 MG tablet Take 81 mg by mouth daily.    . carvedilol (COREG) 12.5 MG tablet TAKE 1 TABLET(12.5 MG) BY MOUTH TWICE DAILY WITH A MEAL 60 tablet 0  . clotrimazole (LOTRIMIN) 1 % external solution Apply 1 application topically 2 (two) times daily. In between toes and nails 60 mL 5  . ketoconazole (NIZORAL) 2 % cream Apply 1 application topically daily. 30 g 5   No current facility-administered medications on file prior to visit.     Allergies  Allergen Reactions  . Codeine Nausea Only    Objective: Physical Exam  General: Well developed, nourished, no acute distress, awake, alert and oriented x 3  Vascular: Dorsalis pedis artery 2/4 bilateral, Posterior tibial artery 2/4 bilateral, skin temperature warm to warm proximal to distal bilateral lower extremities, no varicosities, pedal hair present bilateral.  Neurological: Gross sensation present via light touch bilateral.   Dermatological: Skin is warm, dry, and supple bilateral, Nails 1-10 are tender, short thick, and discolored with mild subungal debris L>R , no webspace macerations present bilateral, no open lesions present bilateral, no callus/corns/hyperkeratotic tissue present bilateral. + scaly skin in annular fashion L>R foot consistent with tinea. No other signs of infection bilateral.  Musculoskeletal: No boney deformities noted bilateral. Muscular strength within normal limits without painon range of motion. No pain with calf compression  bilateral.  Assessment and Plan:  Problem List Items Addressed This Visit    None    Visit Diagnoses    Tinea    -  Primary   Relevant Medications   Terbinafine HCl 1 % SOLN   Nail fungus       Relevant Medications   Terbinafine HCl 1 % SOLN   Toe pain, bilateral         -Examined patient -Continue with topcials for tinea pedis as previously Rx; Changed Ketoconazole to Lamisil aerosol to see if this will continue to help her skin changes on left  -Continue with laser for nails  -Advised good hygiene habits -Patient to return for laser with Janett Billow Q or sooner if symptoms worsen.  Landis Martins, DPM

## 2017-08-30 NOTE — Progress Notes (Signed)
Pt presents with mycotic infection of nails 1-5 bilateral  All other systems are negative  Laser therapy administered to affected nails and tolerated well. All safety precautions were in place. RE-appointed in 4 weeks for 4th treatment 

## 2017-08-30 NOTE — Progress Notes (Signed)
Agree with below  -Dr. Chauncey Cruel

## 2017-10-03 ENCOUNTER — Ambulatory Visit: Payer: BLUE CROSS/BLUE SHIELD

## 2017-12-09 NOTE — Progress Notes (Addendum)
[facility] [address], [place], [place] [address] [phone]  Date:  12/11/2017   Name:  Natasha [name]   DOB:  [birth year]   MRN:  [id number]  PCP:  Darreld Mclean, MD    Chief Complaint: No chief complaint on file.   History of Present Illness:  Natasha [name] is a 57 y.o. very pleasant female patient who presents with the following:  Here today for a CPE History of obesity, anemia, skin cancer, spinal stenosis I last saw her in 09/2016: She works at [employer] in the [employer] pool and will check her BP while at work.  Average readings around 180/90.  Her mother and father are deceased but they both had HTN.  Her 2 brothers are in good health. She has 2 sons ages 20 and 85, they are doing very well.   Obese but at her current weight for about 10 years.    Pap: approx 7 years ago- offered to do today but she would rather defer Mammo: never Colon: not done yet - discussed cologuard and she is ok with doing this  Flu: this was done at work, October Labs:  Needs to be done, she is fasting today   She is having a left knee scope next week for a torn meniscus Her surgeon is Dr. Veverly Fells She was not told that she needs any pre-op forms completed She also asks about a referral to Dr. Leafy Ro[facility]  She is still working in the nursing float pool S/p menopause   She has stopped her coreg, and her BP had come down well as she lost some weight.  However she put the weight back on and notes that her BP is back up.   While she was on coreg her pulse might get into the 50s and she often felt tired,  She does not think there is any particular reason she was on coreg forBP Her BP at work has been 140- 150/80s when she checks it at home  Patient Active Problem List   Diagnosis Date Noted  . Obesity 04/27/2016  . Pain in the chest 04/27/2016    Past Medical History:  Diagnosis  Date  . Allergy   . Anemia   . Cancer ([place])   . Spinal stenosis     Past Surgical History:  Procedure Laterality Date  . CESAREAN SECTION      Social History   Tobacco Use  . Smoking status: Never Smoker  . Smokeless tobacco: Never Used  Substance Use Topics  . Alcohol use: No  . Drug use: No    Family History  Problem Relation Age of Onset  . Cancer Father     Allergies  Allergen Reactions  . Codeine Nausea Only    Medication list has been reviewed and updated.  Current Outpatient Medications on File Prior to Visit  Medication Sig Dispense Refill  . aspirin 81 MG tablet Take 81 mg by mouth daily.    . carvedilol (COREG) 12.5 MG tablet TAKE 1 TABLET(12.5 MG) BY MOUTH TWICE DAILY WITH A MEAL (Patient not taking: Reported on 12/11/2017) 60 tablet 0  . clotrimazole (LOTRIMIN) 1 % external solution Apply 1 application topically 2 (two) times daily. In between toes and nails (Patient not taking: Reported on 12/11/2017) 60 mL 5  . ketoconazole (NIZORAL) 2 % cream Apply 1 application topically daily. (Patient  not taking: Reported on 12/11/2017) 30 g 5  . Terbinafine HCl 1 % SOLN Spray daily to feet at bedtime (Patient not taking: Reported on 12/11/2017) 125 mL 2   No current facility-administered medications on file prior to visit.     Review of Systems:  As per HPI- otherwise negative. No fever or chills   Physical Examination: Vitals:   12/11/17 0937  BP: (!) 152/90  Pulse: 80  Resp: 16  Temp: 97.9 F (36.6 C)  SpO2: 99%   Vitals:   12/11/17 0937  Weight: 248 lb 3.2 oz (112.6 kg)  Height: 5\' 5"  (1.651 m)   Body mass index is 41.3 kg/m. Ideal Body Weight: Weight in (lb) to have BMI = 25: 149.9  GEN: WDWN, NAD, Non-toxic, A & O x 3 HEENT: Atraumatic, Normocephalic. Neck supple. No masses, No LAD.  Bilateral TM wnl, oropharynx normal.  PEERL,EOMI.   Ears and Nose: No external deformity. CV: RRR, No M/G/R. No JVD. No thrill. No extra heart sounds. PULM:  CTA B, no wheezes, crackles, rhonchi. No retractions. No resp. distress. No accessory muscle use. ABD: S, NT, ND, +BS. No rebound. No HSM. EXTR: No c/c/e NEURO Normal gait.  PSYCH: Normally interactive. Conversant. Not depressed or anxious appearing.  Calm demeanor.  Obese, otherwise looks well   Assessment and Plan: Physical exam  Screening for deficiency anemia - Plan: CBC  Screening for diabetes mellitus - Plan: Comprehensive metabolic panel, Hemoglobin A1c  Screening for hyperlipidemia - Plan: Lipid panel  Screening for breast cancer - Plan: MM SCREENING BREAST TOMO BILATERAL  Screening for colon cancer  Essential hypertension - Plan: lisinopril (PRINIVIL,ZESTRIL) 5 MG tablet  Here today for a CPE Set her up for a mammo today and ordered cologuard for her  Will start her on low dose lisinopril for her BP- she will let me know how this works for her Encouraged her to come back in for a pap asap Will plan further follow- up pending labs.   Signed Lamar Blinks, MD  Received her labs - message to pt Blood count is normal Metabolic profile is normal except your blood sugar is slightly elevated.   Your A1c (which gives [place] your average blood sugar over the previous 3 months) is just barely in the pre-diabetes range.  This means you may be at higher risk for developing diabetes later on.   Losing a few lbs is your best defense against diabetes  Your cholesterol is overall good!    Take care and please update me about how the lisinopril works for your blood pressure Lets plan to meet back in 6 months to check on your blood sugar, etc.  Results for orders placed or performed in visit on 12/11/17  CBC  Result Value Ref Range   WBC 5.1 4.0 - 10.5 K/uL   RBC 5.02 3.87 - 5.11 Mil/uL   Platelets 172.0 150.0 - 400.0 K/uL   Hemoglobin 14.0 12.0 - 15.0 g/dL   HCT 42.3 36.0 - 46.0 %   MCV 84.2 78.0 - 100.0 fl   MCHC 33.2 30.0 - 36.0 g/dL   RDW 13.3 11.5 - 15.5 %  Comprehensive  metabolic panel  Result Value Ref Range   Sodium 139 135 - 145 mEq/L   Potassium 4.1 3.5 - 5.1 mEq/L   Chloride 103 96 - 112 mEq/L   CO2 30 19 - 32 mEq/L   Glucose, Bld 111 (H) 70 - 99 mg/dL   BUN 17 6 - 23  mg/dL   Creatinine, Ser 0.66 0.40 - 1.20 mg/dL   Total Bilirubin 0.6 0.2 - 1.2 mg/dL   Alkaline Phosphatase 59 39 - 117 U/L   AST 18 0 - 37 U/L   ALT 20 0 - 35 U/L   Total Protein 7.0 6.0 - 8.3 g/dL   Albumin 4.1 3.5 - 5.2 g/dL   Calcium 9.4 8.4 - 10.5 mg/dL   GFR 98.19 >60.00 mL/min  Hemoglobin A1c  Result Value Ref Range   Hgb A1c MFr Bld 5.8 4.6 - 6.5 %  Lipid panel  Result Value Ref Range   Cholesterol 189 0 - 200 mg/dL   Triglycerides 85.0 0.0 - 149.0 mg/dL   HDL 44.00 >39.00 mg/dL   VLDL 17.0 0.0 - 40.0 mg/dL   LDL Cholesterol 128 (H) 0 - 99 mg/dL   Total CHOL/HDL Ratio 4    NonHDL 145.28

## 2017-12-11 ENCOUNTER — Ambulatory Visit (HOSPITAL_BASED_OUTPATIENT_CLINIC_OR_DEPARTMENT_OTHER)
Admission: RE | Admit: 2017-12-11 | Discharge: 2017-12-11 | Disposition: A | Discharge status: Home / Self Care | Payer: BLUE CROSS/BLUE SHIELD | Source: Ambulatory Visit | Attending: Family Medicine | Admitting: Family Medicine

## 2017-12-11 ENCOUNTER — Encounter: Payer: Self-pay | Admitting: Family Medicine

## 2017-12-11 ENCOUNTER — Encounter (HOSPITAL_BASED_OUTPATIENT_CLINIC_OR_DEPARTMENT_OTHER): Payer: Self-pay

## 2017-12-11 ENCOUNTER — Ambulatory Visit (INDEPENDENT_AMBULATORY_CARE_PROVIDER_SITE_OTHER): Payer: BLUE CROSS/BLUE SHIELD | Admitting: Family Medicine

## 2017-12-11 VITALS — BP 152/90 | HR 80 | Temp 97.9°F | Resp 16 | Ht 65.0 in | Wt 248.2 lb

## 2017-12-11 DIAGNOSIS — Z130 Encounter for screening for diseases of the blood and blood-forming organs and certain disorders involving the immune mechanism: Secondary | ICD-10-CM

## 2017-12-11 DIAGNOSIS — Z1239 Encounter for other screening for malignant neoplasm of breast: Secondary | ICD-10-CM

## 2017-12-11 DIAGNOSIS — I10 Essential (primary) hypertension: Secondary | ICD-10-CM

## 2017-12-11 DIAGNOSIS — Z131 Encounter for screening for diabetes mellitus: Secondary | ICD-10-CM | POA: Diagnosis not present

## 2017-12-11 DIAGNOSIS — Z1231 Encounter for screening mammogram for malignant neoplasm of breast: Secondary | ICD-10-CM

## 2017-12-11 DIAGNOSIS — Z0000 Encounter for general adult medical examination without abnormal findings: Secondary | ICD-10-CM

## 2017-12-11 DIAGNOSIS — Z13220 Encounter for screening for lipoid disorders: Secondary | ICD-10-CM | POA: Diagnosis not present

## 2017-12-11 DIAGNOSIS — Z1211 Encounter for screening for malignant neoplasm of colon: Secondary | ICD-10-CM | POA: Diagnosis not present

## 2017-12-11 LAB — LIPID PANEL
Cholesterol: 189 mg/dL (ref 0–200)
HDL: 44 mg/dL
LDL Cholesterol: 128 mg/dL — ABNORMAL HIGH (ref 0–99)
NonHDL: 145.28
Total CHOL/HDL Ratio: 4
Triglycerides: 85 mg/dL (ref 0.0–149.0)
VLDL: 17 mg/dL (ref 0.0–40.0)

## 2017-12-11 LAB — COMPREHENSIVE METABOLIC PANEL
ALBUMIN: 4.1 g/dL (ref 3.5–5.2)
ALK PHOS: 59 U/L (ref 39–117)
ALT: 20 U/L (ref 0–35)
AST: 18 U/L (ref 0–37)
BILIRUBIN TOTAL: 0.6 mg/dL (ref 0.2–1.2)
BUN: 17 mg/dL (ref 6–23)
CALCIUM: 9.4 mg/dL (ref 8.4–10.5)
CO2: 30 meq/L (ref 19–32)
CREATININE: 0.66 mg/dL (ref 0.40–1.20)
Chloride: 103 mEq/L (ref 96–112)
GFR: 98.19 mL/min (ref >60.00)
Glucose, Bld: 111 mg/dL — ABNORMAL HIGH (ref 70–99)
Potassium: 4.1 mEq/L (ref 3.5–5.1)
Sodium: 139 mEq/L (ref 135–145)
Total Protein: 7 g/dL (ref 6.0–8.3)

## 2017-12-11 LAB — CBC
HCT: 42.3 % (ref 36.0–46.0)
Hemoglobin: 14 g/dL (ref 12.0–15.0)
MCHC: 33.2 g/dL (ref 30.0–36.0)
MCV: 84.2 fl (ref 78.0–100.0)
PLATELETS: 172 10*3/uL (ref 150.0–400.0)
RBC: 5.02 Mil/uL (ref 3.87–5.11)
RDW: 13.3 % (ref 11.5–15.5)
WBC: 5.1 10*3/uL (ref 4.0–10.5)

## 2017-12-11 LAB — HEMOGLOBIN A1C: Hgb A1c MFr Bld: 5.8 % (ref 4.6–6.5)

## 2017-12-11 MED ORDER — LISINOPRIL 5 MG PO TABS: 5.0000 mg | ORAL_TABLET | Freq: Every day | ORAL | 3 refills | Status: DC

## 2017-12-11 NOTE — Addendum Note (Signed)
Addended by: Lamar Blinks C on: 12/11/2017 08:23 PM   Modules accepted: Orders

## 2017-12-11 NOTE — Patient Instructions (Addendum)
It was good to see you today! Please start on lisinopril 5 mg daily for your blood pressure- check your BP at home/ work and let me know how it looks on this medication  Please go to the imaging dept on the ground floor after your labs today and get a mammogram!  We will also set you up for cologuard screening for colon cancer   We will set you up to see Dr. Leafy Ro as well    Health Maintenance for Postmenopausal Women Menopause is a normal process in which your reproductive ability comes to an end. This process happens gradually over a span of months to years, usually between the ages of 26 and 60. Menopause is complete when you have missed 12 consecutive menstrual periods. It is important to talk with your health care provider about some of the most common conditions that affect postmenopausal women, such as heart disease, cancer, and bone loss (osteoporosis). Adopting a healthy lifestyle and getting preventive care can help to promote your health and wellness. Those actions can also lower your chances of developing some of these common conditions. What should I know about menopause? During menopause, you may experience a number of symptoms, such as:  Moderate-to-severe hot flashes.  Night sweats.  Decrease in sex drive.  Mood swings.  Headaches.  Tiredness.  Irritability.  Memory problems.  Insomnia.  Choosing to treat or not to treat menopausal changes is an individual decision that you make with your health care provider. What should I know about hormone replacement therapy and supplements? Hormone therapy products are effective for treating symptoms that are associated with menopause, such as hot flashes and night sweats. Hormone replacement carries certain risks, especially as you become older. If you are thinking about using estrogen or estrogen with progestin treatments, discuss the benefits and risks with your health care provider. What should I know about heart disease  and stroke? Heart disease, heart attack, and stroke become more likely as you age. This may be due, in part, to the hormonal changes that your body experiences during menopause. These can affect how your body processes dietary fats, triglycerides, and cholesterol. Heart attack and stroke are both medical emergencies. There are many things that you can do to help prevent heart disease and stroke:  Have your blood pressure checked at least every 1-2 years. High blood pressure causes heart disease and increases the risk of stroke.  If you are 78-92 years old, ask your health care provider if you should take aspirin to prevent a heart attack or a stroke.  Do not use any tobacco products, including cigarettes, chewing tobacco, or electronic cigarettes. If you need help quitting, ask your health care provider.  It is important to eat a healthy diet and maintain a healthy weight. ? Be sure to include plenty of vegetables, fruits, low-fat dairy products, and lean protein. ? Avoid eating foods that are high in solid fats, added sugars, or salt (sodium).  Get regular exercise. This is one of the most important things that you can do for your health. ? Try to exercise for at least 150 minutes each week. The type of exercise that you do should increase your heart rate and make you sweat. This is known as moderate-intensity exercise. ? Try to do strengthening exercises at least twice each week. Do these in addition to the moderate-intensity exercise.  Know your numbers.Ask your health care provider to check your cholesterol and your blood glucose. Continue to have your blood tested  as directed by your health care provider.  What should I know about cancer screening? There are several types of cancer. Take the following steps to reduce your risk and to catch any cancer development as early as possible. Breast Cancer  Practice breast self-awareness. ? This means understanding how your breasts normally  appear and feel. ? It also means doing regular breast self-exams. Let your health care provider know about any changes, no matter how small.  If you are 5 or older, have a clinician do a breast exam (clinical breast exam or CBE) every year. Depending on your age, family history, and medical history, it may be recommended that you also have a yearly breast X-ray (mammogram).  If you have a family history of breast cancer, talk with your health care provider about genetic screening.  If you are at high risk for breast cancer, talk with your health care provider about having an MRI and a mammogram every year.  Breast cancer (BRCA) gene test is recommended for women who have family members with BRCA-related cancers. Results of the assessment will determine the need for genetic counseling and BRCA1 and for BRCA2 testing. BRCA-related cancers include these types: ? Breast. This occurs in males or females. ? Ovarian. ? Tubal. This may also be called fallopian tube cancer. ? Cancer of the abdominal or pelvic lining (peritoneal cancer). ? Prostate. ? Pancreatic.  Cervical, Uterine, and Ovarian Cancer Your health care provider may recommend that you be screened regularly for cancer of the pelvic organs. These include your ovaries, uterus, and vagina. This screening involves a pelvic exam, which includes checking for microscopic changes to the surface of your cervix (Pap test).  For women ages 21-65, health care providers may recommend a pelvic exam and a Pap test every three years. For women ages 49-65, they may recommend the Pap test and pelvic exam, combined with testing for human papilloma virus (HPV), every five years. Some types of HPV increase your risk of cervical cancer. Testing for HPV may also be done on women of any age who have unclear Pap test results.  Other health care providers may not recommend any screening for nonpregnant women who are considered low risk for pelvic cancer and have no  symptoms. Ask your health care provider if a screening pelvic exam is right for you.  If you have had past treatment for cervical cancer or a condition that could lead to cancer, you need Pap tests and screening for cancer for at least 20 years after your treatment. If Pap tests have been discontinued for you, your risk factors (such as having a new sexual partner) need to be reassessed to determine if you should start having screenings again. Some women have medical problems that increase the chance of getting cervical cancer. In these cases, your health care provider may recommend that you have screening and Pap tests more often.  If you have a family history of uterine cancer or ovarian cancer, talk with your health care provider about genetic screening.  If you have vaginal bleeding after reaching menopause, tell your health care provider.  There are currently no reliable tests available to screen for ovarian cancer.  Lung Cancer Lung cancer screening is recommended for adults 13-2 years old who are at high risk for lung cancer because of a history of smoking. A yearly low-dose CT scan of the lungs is recommended if you:  Currently smoke.  Have a history of at least 30 pack-years of smoking and you  currently smoke or have quit within the past 15 years. A pack-year is smoking an average of one pack of cigarettes per day for one year.  Yearly screening should:  Continue until it has been 15 years since you quit.  Stop if you develop a health problem that would prevent you from having lung cancer treatment.  Colorectal Cancer  This type of cancer can be detected and can often be prevented.  Routine colorectal cancer screening usually begins at age 87 and continues through age 2.  If you have risk factors for colon cancer, your health care provider may recommend that you be screened at an earlier age.  If you have a family history of colorectal cancer, talk with your health care  provider about genetic screening.  Your health care provider may also recommend using home test kits to check for hidden blood in your stool.  A small camera at the end of a tube can be used to examine your colon directly (sigmoidoscopy or colonoscopy). This is done to check for the earliest forms of colorectal cancer.  Direct examination of the colon should be repeated every 5-10 years until age 58. However, if early forms of precancerous polyps or small growths are found or if you have a family history or genetic risk for colorectal cancer, you may need to be screened more often.  Skin Cancer  Check your skin from head to toe regularly.  Monitor any moles. Be sure to tell your health care provider: ? About any new moles or changes in moles, especially if there is a change in a mole's shape or color. ? If you have a mole that is larger than the size of a pencil eraser.  If any of your family members has a history of skin cancer, especially at a young age, talk with your health care provider about genetic screening.  Always use sunscreen. Apply sunscreen liberally and repeatedly throughout the day.  Whenever you are outside, protect yourself by wearing long sleeves, pants, a wide-brimmed hat, and sunglasses.  What should I know about osteoporosis? Osteoporosis is a condition in which bone destruction happens more quickly than new bone creation. After menopause, you may be at an increased risk for osteoporosis. To help prevent osteoporosis or the bone fractures that can happen because of osteoporosis, the following is recommended:  If you are 13-92 years old, get at least 1,000 mg of calcium and at least 600 mg of vitamin D per day.  If you are older than age 28 but younger than age 41, get at least 1,200 mg of calcium and at least 600 mg of vitamin D per day.  If you are older than age 61, get at least 1,200 mg of calcium and at least 800 mg of vitamin D per day.  Smoking and excessive  alcohol intake increase the risk of osteoporosis. Eat foods that are rich in calcium and vitamin D, and do weight-bearing exercises several times each week as directed by your health care provider. What should I know about how menopause affects my mental health? Depression may occur at any age, but it is more common as you become older. Common symptoms of depression include:  Low or sad mood.  Changes in sleep patterns.  Changes in appetite or eating patterns.  Feeling an overall lack of motivation or enjoyment of activities that you previously enjoyed.  Frequent crying spells.  Talk with your health care provider if you think that you are experiencing depression. What  should I know about immunizations? It is important that you get and maintain your immunizations. These include:  Tetanus, diphtheria, and pertussis (Tdap) booster vaccine.  Influenza every year before the flu season begins.  Pneumonia vaccine.  Shingles vaccine.  Your health care provider may also recommend other immunizations. This information is not intended to replace advice given to you by your health care provider. Make sure you discuss any questions you have with your health care provider. Document Released: 12/30/2005 Document Revised: 05/27/2016 Document Reviewed: 08/11/2015 Elsevier Interactive Patient Education  2018 [employer].

## 2017-12-13 ENCOUNTER — Telehealth: Payer: Self-pay | Admitting: *Deleted

## 2017-12-13 NOTE — Telephone Encounter (Signed)
Received Cologuard Incomplete Order: [id number]; order has been faxed back d/t missing information necessary to send patient a Cologuard kit. [employer] has made several attempts to reach patient by phone and has not been able to reach them; forwarded to provider/SLS 01/23

## 2017-12-20 HISTORY — PX: KNEE ARTHROSCOPY: SUR90

## 2018-01-16 ENCOUNTER — Ambulatory Visit: Payer: BLUE CROSS/BLUE SHIELD | Admitting: Physical Therapy

## 2018-01-25 ENCOUNTER — Encounter (INDEPENDENT_AMBULATORY_CARE_PROVIDER_SITE_OTHER): Payer: BLUE CROSS/BLUE SHIELD

## 2018-02-12 ENCOUNTER — Encounter (INDEPENDENT_AMBULATORY_CARE_PROVIDER_SITE_OTHER): Payer: Self-pay | Admitting: Family Medicine

## 2018-02-12 ENCOUNTER — Ambulatory Visit (INDEPENDENT_AMBULATORY_CARE_PROVIDER_SITE_OTHER): Payer: BLUE CROSS/BLUE SHIELD | Admitting: Family Medicine

## 2018-02-12 VITALS — BP 155/84 | HR 83 | Temp 97.6°F | Ht 63.0 in | Wt 241.0 lb

## 2018-02-12 DIAGNOSIS — R0609 Other forms of dyspnea: Secondary | ICD-10-CM

## 2018-02-12 DIAGNOSIS — Z9189 Other specified personal risk factors, not elsewhere classified: Secondary | ICD-10-CM

## 2018-02-12 DIAGNOSIS — R5383 Other fatigue: Secondary | ICD-10-CM

## 2018-02-12 DIAGNOSIS — Z1331 Encounter for screening for depression: Secondary | ICD-10-CM | POA: Diagnosis not present

## 2018-02-12 DIAGNOSIS — Z6841 Body Mass Index (BMI) 40.0 and over, adult: Secondary | ICD-10-CM

## 2018-02-12 DIAGNOSIS — I10 Essential (primary) hypertension: Secondary | ICD-10-CM

## 2018-02-12 DIAGNOSIS — Z0289 Encounter for other administrative examinations: Secondary | ICD-10-CM

## 2018-02-12 NOTE — Progress Notes (Signed)
.  Office: [phone]  /  Fax: [phone]   HPI:   Chief Complaint: OBESITY  Natasha [name] (MR# [id number]) is a 57 y.o. female who presents on 02/12/2018 for obesity evaluation and treatment. Current BMI is Body mass index is 42.69 kg/m.Natasha [name] Natasha [name] has struggled with obesity for years and has been unsuccessful in either losing weight or maintaining long term weight loss. [name] was told about our clinic by a friend. [name] attended our information session and states she is currently in the action stage of change and ready to dedicate time achieving and maintaining a healthier weight.  Natasha [name] states her family eats meals together she thinks her family will eat healthier with  her her desired weight loss is 113 lbs she started gaining weight with her first pregnancy her heaviest weight ever was 258 lbs. she has significant food cravings issues  she snacks frequently in the evenings she frequently makes poor food choices she frequently eats larger portions than normal  she struggles with emotional eating    Fatigue Natasha [name] feels her energy is lower than it should be. This has worsened with weight gain and has not worsened recently. Natasha [name] admits to daytime somnolence and admits to waking up still tired. Patient is at risk for obstructive sleep apnea. Patent has a history of symptoms of daytime fatigue, morning fatigue and hypertension. Patient generally gets 4 or 5 hours of sleep per night, and states they generally have restless sleep. Snoring is not present. Apneic episodes are not present. Epworth Sleepiness Score is 7 Echocardiogram in 2017 was within normal limits. EKG was ordered today and was within normal limits.  Dyspnea on exertion Natasha [name] notes increasing shortness of breath with exercising and seems to be worsening over time with weight gain. She notes getting out of breath sooner with activity than she used to. This has not gotten worse recently. Echocardiogram in 2017 was within normal  limits. EKG was ordered today and was within normal limits. Natasha [name] denies orthopnea.  Hypertension MARGAREE SANDHU is a 57 y.o. female with hypertension. Her blood pressure is slightly elevated today. She just started lisinopril a few weeks ago. Esmeralda Arthur denies chest pain, chest pressure or headache. She is working weight loss to help control her blood pressure with the goal of decreasing her risk of heart attack and stroke. Lornas blood pressure is not currently controlled.  At risk for cardiovascular disease Natasha [name] is at a higher than average risk for cardiovascular disease due to obesity and hypertension. She currently denies any chest pain.  Depression Screen Natasha [name]'s Food and Mood (modified PHQ-9) score was  Depression screen PHQ 2/9 02/12/2018  Decreased Interest 3  Down, Depressed, Hopeless 3  PHQ - 2 Score 6  Altered sleeping 0  Tired, decreased energy 3  Change in appetite 2  Feeling bad or failure about yourself  3  Trouble concentrating 0  Moving slowly or fidgety/restless 3  Suicidal thoughts 0  PHQ-9 Score 17  Difficult doing work/chores Very difficult    ALLERGIES: Allergies  Allergen Reactions  . Codeine Nausea Only    MEDICATIONS: Current Outpatient Medications on File Prior to Visit  Medication Sig Dispense Refill  . aspirin 81 MG tablet Take 81 mg by mouth daily.    Natasha [name] ibuprofen (ADVIL,MOTRIN) 400 MG tablet Take 400 mg by mouth every 6 (six) hours as needed.    Natasha [name] lisinopril (PRINIVIL,ZESTRIL) 5 MG tablet Take 1 tablet (5 mg total) by mouth daily. 90 tablet 3  No current facility-administered medications on file prior to visit.     PAST MEDICAL HISTORY: Past Medical History:  Diagnosis Date  . Allergy   . Anemia   . Back pain   . Cancer ([place])   . HTN (hypertension)   . SOB (shortness of breath) on exertion   . Spinal stenosis   . Torn meniscus     PAST SURGICAL HISTORY: Past Surgical History:  Procedure Laterality Date  . CESAREAN SECTION    .  KNEE ARTHROSCOPY  12/20/2017   left knee  . TONSILLECTOMY  1984    SOCIAL HISTORY: Social History   Tobacco Use  . Smoking status: Never Smoker  . Smokeless tobacco: Never Used  Substance Use Topics  . Alcohol use: No  . Drug use: No    FAMILY HISTORY: Family History  Problem Relation Age of Onset  . High blood pressure Mother   . High Cholesterol Mother   . Heart disease Mother   . Cancer Father   . High blood pressure Father     ROS: Review of Systems  Constitutional: Positive for malaise/fatigue.  HENT: Positive for hearing loss.   Eyes:       Wear Glasses or Contacts  Respiratory: Positive for shortness of breath (with walking).   Cardiovascular: Negative for chest pain and orthopnea.       Shortness of Breath with Activity Leg Cramping Calf/Leg Pain with walking Negative for chest pressure  Musculoskeletal: Positive for back pain.       Neck Stiffness Muscle or Joint Pain   Skin:       Hair or Nail Changes  Neurological: Negative for headaches.    PHYSICAL EXAM: Blood pressure (!) 155/84, pulse 83, temperature 97.6 F (36.4 C), temperature source Oral, height 5\' 3"  (1.6 m), weight 241 lb (109.3 kg), SpO2 97 %. Body mass index is 42.69 kg/m. Physical Exam  Constitutional: She is oriented to person, place, and time. She appears well-developed and well-nourished.  HENT:  Head: Normocephalic and atraumatic.  Nose: Nose normal.  Eyes: EOM are normal. No scleral icterus.  Neck: Normal range of motion. Neck supple. No thyromegaly present.  Cardiovascular: Normal rate and regular rhythm.  Pulmonary/Chest: Effort normal. No respiratory distress.  Abdominal: Soft. There is no tenderness.  + obesity  Musculoskeletal: Normal range of motion.  Range of Motion normal in all 4 extremities  Neurological: She is alert and oriented to person, place, and time. Coordination normal.  Skin: Skin is warm and dry.  Psychiatric: She has a normal mood and affect. Her  behavior is normal.  Vitals reviewed.   RECENT LABS AND TESTS: BMET    Component Value Date/Time   NA 139 12/11/2017 1001   K 4.1 12/11/2017 1001   CL 103 12/11/2017 1001   CO2 30 12/11/2017 1001   GLUCOSE 111 (H) 12/11/2017 1001   BUN 17 12/11/2017 1001   CREATININE 0.66 12/11/2017 1001   CREATININE 0.70 10/02/2013 1736   CALCIUM 9.4 12/11/2017 1001   Lab Results  Component Value Date   HGBA1C 5.8 12/11/2017   No results found for: INSULIN CBC    Component Value Date/Time   WBC 5.1 12/11/2017 1001   RBC 5.02 12/11/2017 1001   HGB 14.0 12/11/2017 1001   HCT 42.3 12/11/2017 1001   PLT 172.0 12/11/2017 1001   MCV 84.2 12/11/2017 1001   MCV 88.4 10/02/2013 1736   MCH 27.1 10/02/2013 1736   MCHC 33.2 12/11/2017 1001   RDW  13.3 12/11/2017 1001   Iron/TIBC/Ferritin/ %Sat No results found for: IRON, TIBC, FERRITIN, IRONPCTSAT Lipid Panel     Component Value Date/Time   CHOL 189 12/11/2017 1001   TRIG 85.0 12/11/2017 1001   HDL 44.00 12/11/2017 1001   CHOLHDL 4 12/11/2017 1001   VLDL 17.0 12/11/2017 1001   LDLCALC 128 (H) 12/11/2017 1001   Hepatic Function Panel     Component Value Date/Time   PROT 7.0 12/11/2017 1001   ALBUMIN 4.1 12/11/2017 1001   AST 18 12/11/2017 1001   ALT 20 12/11/2017 1001   ALKPHOS 59 12/11/2017 1001   BILITOT 0.6 12/11/2017 1001      Component Value Date/Time   TSH 2.64 04/27/2016 1547   Vitamin D There are no recent lab results  ECG  shows NSR with a rate of 72 BPM INDIRECT CALORIMETER done today shows a VO2 of 281 and a REE of 1954. Her calculated basal metabolic rate is 7341 thus her basal metabolic rate is better than expected.    ASSESSMENT AND PLAN: Other fatigue - Plan: EKG 12-Lead, Insulin, random, Hemoglobin A1c, VITAMIN D 25 Hydroxy (Vit-D Deficiency, Fractures), Vitamin B12, Folate, T3, T4, free, TSH  Dyspnea on exertion  Essential hypertension - Plan: Comprehensive metabolic panel  Depression screening  At  risk for heart disease  Class 3 severe obesity with serious comorbidity and body mass index (BMI) of 40.0 to 44.9 in adult, unspecified obesity type ([place])  PLAN:  Fatigue Triva was informed that her fatigue may be related to obesity, depression or many other causes. Labs will be ordered, and in the meanwhile Jacia has agreed to work on diet, exercise and weight loss to help with fatigue. Proper sleep hygiene was discussed including the need for 7-8 hours of quality sleep each night. A sleep study was not ordered based on symptoms and Epworth score. We will order indirect calorimetry.  Dyspnea on exertion Kea's shortness of breath appears to be obesity related and exercise induced. She has agreed to work on weight loss and gradually increase exercise to treat her exercise induced shortness of breath. If Naveh follows our instructions and loses weight without improvement of her shortness of breath, we will plan to refer to pulmonology.We will order indirect calorimetry and labs. We will order indirect calorimetry and labs. We will monitor this condition regularly. Caleigh agrees to this plan.  Hypertension We discussed sodium restriction, working on healthy weight loss, and a regular exercise program as the means to achieve improved blood pressure control. Chelse agreed with this plan and agreed to follow up as directed. We will continue to monitor her blood pressure as well as her progress with the above lifestyle modifications. We will check CMP and she will continue her medications as prescribed and will watch for signs of hypotension as she continues her lifestyle modifications.  Cardiovascular risk counseling Morgyn was given extended (15 minutes) coronary artery disease prevention counseling today. She is 57 y.o. female and has risk factors for heart disease including obesity and hypertension. We discussed intensive lifestyle modifications today with an emphasis on specific weight loss  instructions and strategies. Pt was also informed of the importance of increasing exercise and decreasing saturated fats to help prevent heart disease.  Depression Screen Jaimee had a strongly positive depression screening. Depression is commonly associated with obesity and often results in emotional eating behaviors. We will monitor this closely and work on CBT to help improve the non-hunger eating patterns. Referral to Psychology may be required  if no improvement is seen as she continues in our clinic.  Obesity Jalynne is currently in the action stage of change and her goal is to continue with weight loss efforts She has agreed to follow the Category 3 plan +100 calories Astin has been instructed to work up to a goal of 150 minutes of combined cardio and strengthening exercise per week for weight loss and overall health benefits. We discussed the following Behavioral Modification Strategies today: increasing lean protein intake, no skipping meals, keeping healthy foods in the home and better snacking choices  Kaimana has agreed to follow up with our clinic in 2 weeks. She was informed of the importance of frequent follow up visits to maximize her success with intensive lifestyle modifications for her multiple health conditions. She was informed we would discuss her lab results at her next visit unless there is a critical issue that needs to be addressed sooner. Imunique agreed to keep her next visit at the agreed upon time to discuss these results.    OBESITY BEHAVIORAL INTERVENTION VISIT  Today's visit was # 1 out of 22.  Starting weight: 241 lbs Starting date: 02/12/18 Today's weight : 241 lbs Today's date: 02/12/2018 Total lbs lost to date: 0 (Patients must lose 7 lbs in the first 6 months to continue with counseling)   ASK: We discussed the diagnosis of obesity with Esmeralda Arthur today and Cornisha agreed to give [place] permission to discuss obesity behavioral modification therapy  today.  ASSESS: Monnie has the diagnosis of obesity and her BMI today is 42.7 Rasheda is in the action stage of change   ADVISE: Laterrica was educated on the multiple health risks of obesity as well as the benefit of weight loss to improve her health. She was advised of the need for long term treatment and the importance of lifestyle modifications.  AGREE: Multiple dietary modification options and treatment options were discussed and  Darcelle agreed to the above obesity treatment plan.   I, Doreene Nest, am acting as transcriptionist for Eber Jones, MD   I have reviewed the above documentation for accuracy and completeness, and I agree with the above. - Ilene Qua, MD

## 2018-02-13 LAB — COMPREHENSIVE METABOLIC PANEL
ALBUMIN: 4.4 g/dL (ref 3.5–5.5)
ALK PHOS: 83 IU/L (ref 39–117)
ALT: 32 IU/L (ref 0–32)
AST: 29 IU/L (ref 0–40)
Albumin/Globulin Ratio: 1.6 (ref 1.2–2.2)
BILIRUBIN TOTAL: 0.6 mg/dL (ref 0.0–1.2)
BUN / CREAT RATIO: 20 (ref 9–23)
BUN: 13 mg/dL (ref 6–24)
CHLORIDE: 103 mmol/L (ref 96–106)
CO2: 23 mmol/L (ref 20–29)
Calcium: 9.7 mg/dL (ref 8.7–10.2)
Creatinine, Ser: 0.66 mg/dL (ref 0.57–1.00)
GFR calc Af Amer: 114 mL/min/{1.73_m2} (ref >59)
GFR calc non Af Amer: 99 mL/min/{1.73_m2} (ref >59)
GLOBULIN, TOTAL: 2.7 g/dL (ref 1.5–4.5)
GLUCOSE: 93 mg/dL (ref 65–99)
Potassium: 4.2 mmol/L (ref 3.5–5.2)
SODIUM: 141 mmol/L (ref 134–144)
Total Protein: 7.1 g/dL (ref 6.0–8.5)

## 2018-02-13 LAB — T4, FREE: FREE T4: 1.38 ng/dL (ref 0.82–1.77)

## 2018-02-13 LAB — INSULIN, RANDOM: INSULIN: 21.9 u[IU]/mL (ref 2.6–24.9)

## 2018-02-13 LAB — VITAMIN D 25 HYDROXY (VIT D DEFICIENCY, FRACTURES): Vit D, 25-Hydroxy: 15.3 ng/mL — ABNORMAL LOW (ref 30.0–100.0)

## 2018-02-13 LAB — VITAMIN B12: Vitamin B-12: 267 pg/mL (ref 232–1245)

## 2018-02-13 LAB — TSH: TSH: 2.04 u[IU]/mL (ref 0.450–4.500)

## 2018-02-13 LAB — HEMOGLOBIN A1C
Est. average glucose Bld gHb Est-mCnc: 111 mg/dL
HEMOGLOBIN A1C: 5.5 % (ref 4.8–5.6)

## 2018-02-13 LAB — FOLATE: Folate: 15.5 ng/mL (ref >3.0)

## 2018-02-13 LAB — T3: T3, Total: 130 ng/dL (ref 71–180)

## 2018-02-26 ENCOUNTER — Ambulatory Visit (INDEPENDENT_AMBULATORY_CARE_PROVIDER_SITE_OTHER): Payer: BLUE CROSS/BLUE SHIELD | Admitting: Family Medicine

## 2018-02-26 ENCOUNTER — Encounter: Payer: Self-pay | Admitting: Family Medicine

## 2018-02-26 VITALS — BP 148/85 | HR 86 | Temp 98.5°F | Ht 63.0 in | Wt 233.0 lb

## 2018-02-26 DIAGNOSIS — Z6841 Body Mass Index (BMI) 40.0 and over, adult: Secondary | ICD-10-CM | POA: Diagnosis not present

## 2018-02-26 DIAGNOSIS — R7303 Prediabetes: Secondary | ICD-10-CM | POA: Diagnosis not present

## 2018-02-26 DIAGNOSIS — Z9189 Other specified personal risk factors, not elsewhere classified: Secondary | ICD-10-CM | POA: Diagnosis not present

## 2018-02-26 DIAGNOSIS — E559 Vitamin D deficiency, unspecified: Secondary | ICD-10-CM

## 2018-02-26 MED ORDER — VITAMIN D (ERGOCALCIFEROL) 1.25 MG (50000 UNIT) PO CAPS: 50000.0000 [IU] | ORAL_CAPSULE | ORAL | 0 refills | Status: DC

## 2018-02-26 NOTE — Progress Notes (Signed)
Office: [phone]  /  Fax: [phone]   HPI:   Chief Complaint: OBESITY Natasha [name] is here to discuss her progress with her obesity treatment plan. She is on the Category 3 plan + 100 calories and is following her eating plan approximately 80 % of the time. She states she is exercising 0 minutes 0 times per week. Natasha [name] did well with eating on days off and struggled with getting food in during days she worked. She struggled with eating breakfast especially.  Her weight is 233 lb (105.7 kg) today and has had a weight loss of 8 pounds over a period of 2 weeks since her last visit. She has lost 8 lbs since starting treatment with [place].  Vitamin D Deficiency Natasha [name] has a diagnosis of vitamin D deficiency. She is not on Vit D supplement currently, Vit D level low. She denies nausea, vomiting or muscle weakness.  At risk for osteopenia and osteoporosis Natasha [name] is at higher risk of osteopenia and osteoporosis due to vitamin D deficiency.   Pre-Diabetes Natasha [name] has a diagnosis of pre-diabetes based on her elevated Hgb A1c and was informed this puts her at greater risk of developing diabetes. Hgb A1c at 5.5, insulin at 21.9, and her last Hgb A1c was 5.8. She is not taking metformin currently and continues to work on diet and exercise to decrease risk of diabetes. She denies nausea or hypoglycemia.  ALLERGIES: Allergies  Allergen Reactions  . Codeine Nausea Only    MEDICATIONS: Current Outpatient Medications on File Prior to Visit  Medication Sig Dispense Refill  . aspirin 81 MG tablet Take 81 mg by mouth daily.    [name] ibuprofen (ADVIL,MOTRIN) 400 MG tablet Take 400 mg by mouth every 6 (six) hours as needed.    [name] lisinopril (PRINIVIL,ZESTRIL) 5 MG tablet Take 1 tablet (5 mg total) by mouth daily. 90 tablet 3   No current facility-administered medications on file prior to visit.     PAST MEDICAL HISTORY: Past Medical History:  Diagnosis Date  . Allergy   . Anemia   . Back pain   . Cancer  ([place])   . HTN (hypertension)   . SOB (shortness of breath) on exertion   . Spinal stenosis   . Torn meniscus     PAST SURGICAL HISTORY: Past Surgical History:  Procedure Laterality Date  . CESAREAN SECTION    . KNEE ARTHROSCOPY  12/20/2017   left knee  . TONSILLECTOMY  1984    SOCIAL HISTORY: Social History   Tobacco Use  . Smoking status: Never Smoker  . Smokeless tobacco: Never Used  Substance Use Topics  . Alcohol use: No  . Drug use: No    FAMILY HISTORY: Family History  Problem Relation Age of Onset  . High blood pressure Mother   . High Cholesterol Mother   . Heart disease Mother   . Cancer Father   . High blood pressure Father     ROS: Review of Systems  Constitutional: Positive for weight loss.  Gastrointestinal: Negative for nausea and vomiting.  Musculoskeletal:       Negative muscle weakness  Endo/Heme/Allergies:       Negative hypoglycemia    PHYSICAL EXAM: Blood pressure (!) 148/85, pulse 86, temperature 98.5 F (36.9 C), temperature source Oral, height 5\' 3"  (1.6 m), weight 233 lb (105.7 kg), SpO2 98 %. Body mass index is 41.27 kg/m. Physical Exam  Constitutional: She is oriented to person, place, and time. She appears well-developed and well-nourished.  Cardiovascular: Normal rate.  Pulmonary/Chest: Effort normal.  Musculoskeletal: Normal range of motion.  Neurological: She is oriented to person, place, and time.  Skin: Skin is warm and dry.  Psychiatric: She has a normal mood and affect. Her behavior is normal.  Vitals reviewed.   RECENT LABS AND TESTS: BMET    Component Value Date/Time   NA 141 02/12/2018 1237   K 4.2 02/12/2018 1237   CL 103 02/12/2018 1237   CO2 23 02/12/2018 1237   GLUCOSE 93 02/12/2018 1237   GLUCOSE 111 (H) 12/11/2017 1001   BUN 13 02/12/2018 1237   CREATININE 0.66 02/12/2018 1237   CREATININE 0.70 10/02/2013 1736   CALCIUM 9.7 02/12/2018 1237   GFRNONAA 99 02/12/2018 1237   GFRAA 114 02/12/2018 1237     Lab Results  Component Value Date   HGBA1C 5.5 02/12/2018   HGBA1C 5.8 12/11/2017   HGBA1C 5.7 04/27/2016   Lab Results  Component Value Date   INSULIN 21.9 02/12/2018   CBC    Component Value Date/Time   WBC 5.1 12/11/2017 1001   RBC 5.02 12/11/2017 1001   HGB 14.0 12/11/2017 1001   HCT 42.3 12/11/2017 1001   PLT 172.0 12/11/2017 1001   MCV 84.2 12/11/2017 1001   MCV 88.4 10/02/2013 1736   MCH 27.1 10/02/2013 1736   MCHC 33.2 12/11/2017 1001   RDW 13.3 12/11/2017 1001   Iron/TIBC/Ferritin/ %Sat No results found for: IRON, TIBC, FERRITIN, IRONPCTSAT Lipid Panel     Component Value Date/Time   CHOL 189 12/11/2017 1001   TRIG 85.0 12/11/2017 1001   HDL 44.00 12/11/2017 1001   CHOLHDL 4 12/11/2017 1001   VLDL 17.0 12/11/2017 1001   LDLCALC 128 (H) 12/11/2017 1001   Hepatic Function Panel     Component Value Date/Time   PROT 7.1 02/12/2018 1237   ALBUMIN 4.4 02/12/2018 1237   AST 29 02/12/2018 1237   ALT 32 02/12/2018 1237   ALKPHOS 83 02/12/2018 1237   BILITOT 0.6 02/12/2018 1237      Component Value Date/Time   TSH 2.040 02/12/2018 1237   TSH 2.64 04/27/2016 1547  Results for Natasha, [name] (MRN [id number]) as of 02/26/2018 17:37  Ref. Range 02/12/2018 12:37  Vitamin D, 25-Hydroxy Latest Ref Range: 30.0 - 100.0 ng/mL 15.3 (L)    ASSESSMENT AND PLAN: Vitamin D deficiency - Plan: Vitamin D, Ergocalciferol, (DRISDOL) 50000 units CAPS capsule  Pre-diabetes  At risk for osteoporosis  Class 3 severe obesity with serious comorbidity and body mass index (BMI) of 40.0 to 44.9 in adult, unspecified obesity type ([place])  PLAN:  Vitamin D Deficiency Natasha [name] was informed that low vitamin D levels contributes to fatigue and are associated with obesity, breast, and colon cancer. Natasha [name] agrees to start prescription Vit D @50 ,000 IU every week #4 with no refills. She will follow up for routine testing of vitamin D, at least 2-3 times per year. She was informed of the risk of  over-replacement of vitamin D and agrees to not increase her dose unless she discusses this with [place] first. We will recheck labs in 3 months and Natasha [name] agrees to follow up with our clinic in 2 weeks.  At risk for osteopenia and osteoporosis Natasha [name] is at risk for osteopenia and osteoporsis due to her vitamin D deficiency. She was encouraged to take her vitamin D and follow her higher calcium diet and increase strengthening exercise to help strengthen her bones and decrease her risk of osteopenia and osteoporosis.  Pre-Diabetes  Natasha [name] will continue to work on weight loss, exercise, and decreasing simple carbohydrates in her diet to help decrease the risk of diabetes. We dicussed metformin including benefits and risks. She was informed that eating too many simple carbohydrates or too many calories at one sitting increases the likelihood of GI side effects. Natasha [name] declined metformin for now and a prescription was not written today. We will recheck labs in 3 months and Natasha [name] agrees to follow up with our clinic in 2 weeks as directed to monitor her progress.  Obesity Natasha [name] is currently in the action stage of change. As such, her goal is to continue with weight loss efforts She has agreed to follow the Category 3 plan + 100 calories Natasha [name] has been instructed to work up to a goal of 150 minutes of combined cardio and strengthening exercise per week for weight loss and overall health benefits. We discussed the following Behavioral Modification Strategies today: increasing lean protein intake, increasing vegetables, work on meal planning and easy cooking plans, no skipping meals, and better snacking choices   Natasha [name] has agreed to follow up with our clinic in 2 weeks. She was informed of the importance of frequent follow up visits to maximize her success with intensive lifestyle modifications for her multiple health conditions.   OBESITY BEHAVIORAL INTERVENTION VISIT  Today's visit was # 2 out of 22.  Starting  weight: 241 lbs Starting date: 02/12/18 Today's weight : 233 lbs Today's date: 02/26/2018 Total lbs lost to date: 8 (Patients must lose 7 lbs in the first 6 months to continue with counseling)   ASK: We discussed the diagnosis of obesity with Natasha [name] today and Natasha [name] agreed to give [place] permission to discuss obesity behavioral modification therapy today.  ASSESS: Natasha [name] has the diagnosis of obesity and her BMI today is 41.28 Natasha [name] is in the action stage of change   ADVISE: Natasha [name] was educated on the multiple health risks of obesity as well as the benefit of weight loss to improve her health. She was advised of the need for long term treatment and the importance of lifestyle modifications.  AGREE: Multiple dietary modification options and treatment options were discussed and  Natasha [name] agreed to the above obesity treatment plan.  I, Trixie Dredge, am acting as transcriptionist for Ilene Qua, MD  I have reviewed the above documentation for accuracy and completeness, and I agree with the above. - Ilene Qua, MD

## 2018-03-13 ENCOUNTER — Ambulatory Visit (INDEPENDENT_AMBULATORY_CARE_PROVIDER_SITE_OTHER): Payer: BLUE CROSS/BLUE SHIELD | Admitting: Family Medicine

## 2018-03-13 VITALS — BP 131/79 | HR 70 | Temp 98.1°F | Ht 63.0 in | Wt 229.0 lb

## 2018-03-13 DIAGNOSIS — E559 Vitamin D deficiency, unspecified: Secondary | ICD-10-CM

## 2018-03-13 DIAGNOSIS — I10 Essential (primary) hypertension: Secondary | ICD-10-CM | POA: Diagnosis not present

## 2018-03-13 DIAGNOSIS — Z6841 Body Mass Index (BMI) 40.0 and over, adult: Secondary | ICD-10-CM

## 2018-03-14 NOTE — Progress Notes (Signed)
Office: [phone]  /  Fax: [phone]   HPI:   Chief Complaint: OBESITY Natasha [name] is here to discuss her progress with her obesity treatment plan. She is on the Category 3 plan  +100 calories and is following her eating plan approximately 90 % of the time. She states she is exercising 0 minutes 0 times per week. Natasha [name] sometimes still struggles to get all the food in, and she is still occasionally missing lunch portions. Her weight is 229 lb (103.9 kg) today and has had a weight loss of 4 pounds over a period of 2 weeks since her last visit. She has lost 12 lbs since starting treatment with [place].  Vitamin D deficiency Natasha [name] has a diagnosis of vitamin D deficiency. She is currently taking vit D and denies nausea, vomiting or muscle weakness.  Hypertension Natasha [name] is a 57 y.o. female with hypertension. Natasha [name] denies chest pain, chest pressure or headache. She is working weight loss to help control her blood pressure with the goal of decreasing her risk of heart attack and stroke. Natasha [name] blood pressure is better controlled today.  ALLERGIES: Allergies  Allergen Reactions  . Codeine Nausea Only    MEDICATIONS: Current Outpatient Medications on File Prior to Visit  Medication Sig Dispense Refill  . aspirin 81 MG tablet Take 81 mg by mouth daily.    [name] ibuprofen (ADVIL,MOTRIN) 400 MG tablet Take 400 mg by mouth every 6 (six) hours as needed.    [name] lisinopril (PRINIVIL,ZESTRIL) 5 MG tablet Take 1 tablet (5 mg total) by mouth daily. 90 tablet 3  . Vitamin D, Ergocalciferol, (DRISDOL) 50000 units CAPS capsule Take 1 capsule (50,000 Units total) by mouth every 7 (seven) days. 4 capsule 0   No current facility-administered medications on file prior to visit.     PAST MEDICAL HISTORY: Past Medical History:  Diagnosis Date  . Allergy   . Anemia   . Back pain   . Cancer ([place])   . HTN (hypertension)   . SOB (shortness of breath) on exertion   . Spinal stenosis   . Torn meniscus      PAST SURGICAL HISTORY: Past Surgical History:  Procedure Laterality Date  . CESAREAN SECTION    . KNEE ARTHROSCOPY  12/20/2017   left knee  . TONSILLECTOMY  1984    SOCIAL HISTORY: Social History   Tobacco Use  . Smoking status: Never Smoker  . Smokeless tobacco: Never Used  Substance Use Topics  . Alcohol use: No  . Drug use: No    FAMILY HISTORY: Family History  Problem Relation Age of Onset  . High blood pressure Mother   . High Cholesterol Mother   . Heart disease Mother   . Cancer Father   . High blood pressure Father     ROS: Review of Systems  Constitutional: Positive for weight loss.  Cardiovascular: Negative for chest pain.       Negative for chest pressure  Gastrointestinal: Negative for nausea and vomiting.  Musculoskeletal:       Negative for muscle weakness  Neurological: Negative for headaches.    PHYSICAL EXAM: Blood pressure 131/79, pulse 70, temperature 98.1 F (36.7 C), temperature source Oral, height 5\' 3"  (1.6 m), weight 229 lb (103.9 kg), SpO2 100 %. Body mass index is 40.57 kg/m. Physical Exam  Constitutional: She is oriented to person, place, and time. She appears well-developed and well-nourished.  Cardiovascular: Normal rate.  Pulmonary/Chest: Effort normal.  Musculoskeletal: Normal range  of motion.  Neurological: She is oriented to person, place, and time.  Skin: Skin is warm and dry.  Psychiatric: She has a normal mood and affect. Her behavior is normal.  Vitals reviewed.   RECENT LABS AND TESTS: BMET    Component Value Date/Time   NA 141 02/12/2018 1237   K 4.2 02/12/2018 1237   CL 103 02/12/2018 1237   CO2 23 02/12/2018 1237   GLUCOSE 93 02/12/2018 1237   GLUCOSE 111 (H) 12/11/2017 1001   BUN 13 02/12/2018 1237   CREATININE 0.66 02/12/2018 1237   CREATININE 0.70 10/02/2013 1736   CALCIUM 9.7 02/12/2018 1237   GFRNONAA 99 02/12/2018 1237   GFRAA 114 02/12/2018 1237   Lab Results  Component Value Date    HGBA1C 5.5 02/12/2018   HGBA1C 5.8 12/11/2017   HGBA1C 5.7 04/27/2016   Lab Results  Component Value Date   INSULIN 21.9 02/12/2018   CBC    Component Value Date/Time   WBC 5.1 12/11/2017 1001   RBC 5.02 12/11/2017 1001   HGB 14.0 12/11/2017 1001   HCT 42.3 12/11/2017 1001   PLT 172.0 12/11/2017 1001   MCV 84.2 12/11/2017 1001   MCV 88.4 10/02/2013 1736   MCH 27.1 10/02/2013 1736   MCHC 33.2 12/11/2017 1001   RDW 13.3 12/11/2017 1001   Iron/TIBC/Ferritin/ %Sat No results found for: IRON, TIBC, FERRITIN, IRONPCTSAT Lipid Panel     Component Value Date/Time   CHOL 189 12/11/2017 1001   TRIG 85.0 12/11/2017 1001   HDL 44.00 12/11/2017 1001   CHOLHDL 4 12/11/2017 1001   VLDL 17.0 12/11/2017 1001   LDLCALC 128 (H) 12/11/2017 1001   Hepatic Function Panel     Component Value Date/Time   PROT 7.1 02/12/2018 1237   ALBUMIN 4.4 02/12/2018 1237   AST 29 02/12/2018 1237   ALT 32 02/12/2018 1237   ALKPHOS 83 02/12/2018 1237   BILITOT 0.6 02/12/2018 1237      Component Value Date/Time   TSH 2.040 02/12/2018 1237   TSH 2.64 04/27/2016 1547   Results for BEN, SANZ (MRN [id number]) as of 03/14/2018 13:33  Ref. Range 02/12/2018 12:37  Vitamin D, 25-Hydroxy Latest Ref Range: 30.0 - 100.0 ng/mL 15.3 (L)   ASSESSMENT AND PLAN: Essential hypertension  Vitamin D deficiency  Class 3 severe obesity with serious comorbidity and body mass index (BMI) of 40.0 to 44.9 in adult, unspecified obesity type (HCC)  PLAN:  Vitamin D Deficiency Natasha [name] was informed that low vitamin D levels contributes to fatigue and are associated with obesity, breast, and colon cancer. She agrees to continue to take prescription Vit D @50 ,000 IU every week (no refill needed) and will follow up for routine testing of vitamin D, at least 2-3 times per year. She was informed of the risk of over-replacement of vitamin D and agrees to not increase her dose unless she discusses this with [place]  first.  Hypertension We discussed sodium restriction, working on healthy weight loss, and a regular exercise program as the means to achieve improved blood pressure control. Natasha [name] agreed with this plan and agreed to follow up as directed. We will continue to monitor her blood pressure as well as her progress with the above lifestyle modifications. She will continue lisinopril 5 mg daily and will watch for signs of hypotension as she continues her lifestyle modifications.  We spent > than 50% of the 15 minute visit on the counseling as documented in the note.  Obesity Natasha [name] is  currently in the action stage of change. As such, her goal is to continue with weight loss efforts She has agreed to follow the Category 3 plan +100 calories Natasha [name] has been instructed to work up to a goal of 150 minutes of combined cardio and strengthening exercise per week for weight loss and overall health benefits. We discussed the following Behavioral Modification Strategies today: no skipping meals, better snacking choices, planning for success, increasing lean protein intake and work on meal planning and easy cooking plans  Natasha [name] has agreed to follow up with our clinic in 2 weeks. She was informed of the importance of frequent follow up visits to maximize her success with intensive lifestyle modifications for her multiple health conditions.   OBESITY BEHAVIORAL INTERVENTION VISIT  Today's visit was # 3 out of 22.  Starting weight: 241 lbs Starting date: 02/12/18 Today's weight : 229 lbs  Today's date: 03/13/2018 Total lbs lost to date: 12 (Patients must lose 7 lbs in the first 6 months to continue with counseling)   ASK: We discussed the diagnosis of obesity with Natasha [name] today and Natasha [name] agreed to give [place] permission to discuss obesity behavioral modification therapy today.  ASSESS: Natasha [name] has the diagnosis of obesity and her BMI today is 40.58 Natasha [name] is in the action stage of change   ADVISE: Natasha [name] was  educated on the multiple health risks of obesity as well as the benefit of weight loss to improve her health. She was advised of the need for long term treatment and the importance of lifestyle modifications.  AGREE: Multiple dietary modification options and treatment options were discussed and  Natasha [name] agreed to the above obesity treatment plan.  I, Doreene Nest, am acting as transcriptionist for Dennard Nip, MD  I have reviewed the above documentation for accuracy and completeness, and I agree with the above. - Ilene Qua, MD

## 2018-03-28 ENCOUNTER — Ambulatory Visit (INDEPENDENT_AMBULATORY_CARE_PROVIDER_SITE_OTHER): Payer: BLUE CROSS/BLUE SHIELD | Admitting: Family Medicine

## 2018-03-28 ENCOUNTER — Other Ambulatory Visit (INDEPENDENT_AMBULATORY_CARE_PROVIDER_SITE_OTHER): Payer: Self-pay | Admitting: Family Medicine

## 2018-03-28 DIAGNOSIS — E559 Vitamin D deficiency, unspecified: Secondary | ICD-10-CM

## 2018-03-29 ENCOUNTER — Ambulatory Visit (INDEPENDENT_AMBULATORY_CARE_PROVIDER_SITE_OTHER): Payer: BLUE CROSS/BLUE SHIELD | Admitting: Family Medicine

## 2018-03-29 VITALS — BP 133/79 | HR 70 | Temp 98.4°F | Ht 63.0 in | Wt 226.0 lb

## 2018-03-29 DIAGNOSIS — I10 Essential (primary) hypertension: Secondary | ICD-10-CM

## 2018-03-29 DIAGNOSIS — E559 Vitamin D deficiency, unspecified: Secondary | ICD-10-CM

## 2018-03-29 DIAGNOSIS — Z6841 Body Mass Index (BMI) 40.0 and over, adult: Secondary | ICD-10-CM | POA: Diagnosis not present

## 2018-04-02 NOTE — Progress Notes (Signed)
Office: [phone]  /  Fax: [phone]   HPI:   Chief Complaint: OBESITY Natasha [name] is here to discuss her progress with her obesity treatment plan. She is on the Category 3 plan +100 calories and is following her eating plan approximately 95 % of the time. She states she is exercising 0 minutes 0 times per week. Natasha [name] is still trying to work on getting all the food in during the day. Natasha [name] has eaten out a few times. She is planning to leave for a beach trip after this appointment. Her weight is 226 lb (102.5 kg) today and has had a weight loss of 3 pounds over a period of 2 weeks since her last visit. She has lost 15 lbs since starting treatment with [place].  Vitamin D deficiency Natasha [name] has a diagnosis of vitamin D deficiency. She is currently taking vit D and denies nausea, vomiting or muscle weakness.  Hypertension Natasha [name] is a 57 y.o. female with hypertension. Natasha [name] denies chest pain, chest pressure or headache. She is working weight loss to help control her blood pressure with the goal of decreasing her risk of heart attack and stroke. Natasha [name] blood pressure is well controlled.  ALLERGIES: Allergies  Allergen Reactions  . Codeine Nausea Only    MEDICATIONS: Current Outpatient Medications on File Prior to Visit  Medication Sig Dispense Refill  . aspirin 81 MG tablet Take 81 mg by mouth daily.    [name] lisinopril (PRINIVIL,ZESTRIL) 5 MG tablet Take 1 tablet (5 mg total) by mouth daily. 90 tablet 3  . Vitamin D, Ergocalciferol, (DRISDOL) 50000 units CAPS capsule Take 1 capsule (50,000 Units total) by mouth every 7 (seven) days. 4 capsule 0   No current facility-administered medications on file prior to visit.     PAST MEDICAL HISTORY: Past Medical History:  Diagnosis Date  . Allergy   . Anemia   . Back pain   . Cancer ([place])   . HTN (hypertension)   . SOB (shortness of breath) on exertion   . Spinal stenosis   . Torn meniscus     PAST SURGICAL HISTORY: Past  Surgical History:  Procedure Laterality Date  . CESAREAN SECTION    . KNEE ARTHROSCOPY  12/20/2017   left knee  . TONSILLECTOMY  1984    SOCIAL HISTORY: Social History   Tobacco Use  . Smoking status: Never Smoker  . Smokeless tobacco: Never Used  Substance Use Topics  . Alcohol use: No  . Drug use: No    FAMILY HISTORY: Family History  Problem Relation Age of Onset  . High blood pressure Mother   . High Cholesterol Mother   . Heart disease Mother   . Cancer Father   . High blood pressure Father     ROS: Review of Systems  Constitutional: Positive for weight loss.  Cardiovascular: Negative for chest pain.       Negative for chest pressure  Gastrointestinal: Negative for nausea and vomiting.  Musculoskeletal:       Negative for muscle weakness  Neurological: Negative for headaches.    PHYSICAL EXAM: Blood pressure 133/79, pulse 70, temperature 98.4 F (36.9 C), temperature source Oral, height 5\' 3"  (1.6 m), weight 226 lb (102.5 kg), SpO2 96 %. Body mass index is 40.03 kg/m. Physical Exam  Constitutional: She is oriented to person, place, and time. She appears well-developed and well-nourished.  Cardiovascular: Normal rate.  Pulmonary/Chest: Effort normal.  Musculoskeletal: Normal range of motion.  Neurological: She is  oriented to person, place, and time.  Skin: Skin is warm and dry.  Psychiatric: She has a normal mood and affect. Her behavior is normal.  Vitals reviewed.   RECENT LABS AND TESTS: BMET    Component Value Date/Time   NA 141 02/12/2018 1237   K 4.2 02/12/2018 1237   CL 103 02/12/2018 1237   CO2 23 02/12/2018 1237   GLUCOSE 93 02/12/2018 1237   GLUCOSE 111 (H) 12/11/2017 1001   BUN 13 02/12/2018 1237   CREATININE 0.66 02/12/2018 1237   CREATININE 0.70 10/02/2013 1736   CALCIUM 9.7 02/12/2018 1237   GFRNONAA 99 02/12/2018 1237   GFRAA 114 02/12/2018 1237   Lab Results  Component Value Date   HGBA1C 5.5 02/12/2018   HGBA1C 5.8  12/11/2017   HGBA1C 5.7 04/27/2016   Lab Results  Component Value Date   INSULIN 21.9 02/12/2018   CBC    Component Value Date/Time   WBC 5.1 12/11/2017 1001   RBC 5.02 12/11/2017 1001   HGB 14.0 12/11/2017 1001   HCT 42.3 12/11/2017 1001   PLT 172.0 12/11/2017 1001   MCV 84.2 12/11/2017 1001   MCV 88.4 10/02/2013 1736   MCH 27.1 10/02/2013 1736   MCHC 33.2 12/11/2017 1001   RDW 13.3 12/11/2017 1001   Iron/TIBC/Ferritin/ %Sat No results found for: IRON, TIBC, FERRITIN, IRONPCTSAT Lipid Panel     Component Value Date/Time   CHOL 189 12/11/2017 1001   TRIG 85.0 12/11/2017 1001   HDL 44.00 12/11/2017 1001   CHOLHDL 4 12/11/2017 1001   VLDL 17.0 12/11/2017 1001   LDLCALC 128 (H) 12/11/2017 1001   Hepatic Function Panel     Component Value Date/Time   PROT 7.1 02/12/2018 1237   ALBUMIN 4.4 02/12/2018 1237   AST 29 02/12/2018 1237   ALT 32 02/12/2018 1237   ALKPHOS 83 02/12/2018 1237   BILITOT 0.6 02/12/2018 1237      Component Value Date/Time   TSH 2.040 02/12/2018 1237   TSH 2.64 04/27/2016 1547   Results for MONTRICE, MONTUORI (MRN [id number]) as of 04/02/2018 08:24  Ref. Range 02/12/2018 12:37  Vitamin D, 25-Hydroxy Latest Ref Range: 30.0 - 100.0 ng/mL 15.3 (L)   ASSESSMENT AND PLAN: Vitamin D deficiency  Essential hypertension  Class 3 severe obesity with serious comorbidity and body mass index (BMI) of 40.0 to 44.9 in adult, unspecified obesity type (Natasha [name])  PLAN:  Vitamin D Deficiency Natasha [name] was informed that low vitamin D levels contributes to fatigue and are associated with obesity, breast, and colon cancer. She agrees to continue to take prescription Vit D @50 ,000 IU every week and will follow up for routine testing of vitamin D, at least 2-3 times per year. She was informed of the risk of over-replacement of vitamin D and agrees to not increase her dose unless she discusses this with [place] first.  Hypertension We discussed sodium restriction, working on healthy  weight loss, and a regular exercise program as the means to achieve improved blood pressure control. Natasha [name] agreed with this plan and agreed to follow up as directed. We will continue to monitor her blood pressure as well as her progress with the above lifestyle modifications. She will continue lisinopril as prescribed and will watch for signs of hypotension as she continues her lifestyle modifications.  We spent > than 50% of the 15 minute visit on the counseling as documented in the note.  Obesity Natasha [name] is currently in the action stage of change. As such, her  goal is to continue with weight loss efforts She has agreed to follow the Category 3 plan Natasha [name] has been instructed to work up to a goal of 150 minutes of combined cardio and strengthening exercise per week for weight loss and overall health benefits. We discussed the following Behavioral Modification Strategies today: better snacking choices, planning for success, increasing lean protein intake, increasing vegetables and work on meal planning and easy cooking plans  Natasha [name] has agreed to follow up with our clinic in 2 weeks. She was informed of the importance of frequent follow up visits to maximize her success with intensive lifestyle modifications for her multiple health conditions.   OBESITY BEHAVIORAL INTERVENTION VISIT  Today's visit was # 4 out of 22.  Starting weight: 241 lbs Starting date: 02/12/18 Today's weight : 226 lbs Today's date: 03/29/2018 Total lbs lost to date: 15 (Patients must lose 7 lbs in the first 6 months to continue with counseling)   ASK: We discussed the diagnosis of obesity with Natasha [name] today and Natasha [name] agreed to give [place] permission to discuss obesity behavioral modification therapy today.  ASSESS: Natasha [name] has the diagnosis of obesity and her BMI today is 40.04 Natasha [name] is in the action stage of change   ADVISE: Natasha [name] was educated on the multiple health risks of obesity as well as the benefit of weight  loss to improve her health. She was advised of the need for long term treatment and the importance of lifestyle modifications.  AGREE: Multiple dietary modification options and treatment options were discussed and  Natasha [name] agreed to the above obesity treatment plan.  I, Doreene Nest, am acting as transcriptionist for Eber Jones, MD  I have reviewed the above documentation for accuracy and completeness, and I agree with the above. - Ilene Qua, MD

## 2018-04-19 ENCOUNTER — Ambulatory Visit (INDEPENDENT_AMBULATORY_CARE_PROVIDER_SITE_OTHER): Payer: BLUE CROSS/BLUE SHIELD | Admitting: Family Medicine

## 2018-04-23 ENCOUNTER — Ambulatory Visit (INDEPENDENT_AMBULATORY_CARE_PROVIDER_SITE_OTHER): Payer: BLUE CROSS/BLUE SHIELD | Admitting: Family Medicine

## 2018-04-23 VITALS — HR 67 | Temp 98.0°F | Ht 63.0 in | Wt 220.0 lb

## 2018-04-23 DIAGNOSIS — Z6839 Body mass index (BMI) 39.0-39.9, adult: Secondary | ICD-10-CM

## 2018-04-23 DIAGNOSIS — E559 Vitamin D deficiency, unspecified: Secondary | ICD-10-CM | POA: Diagnosis not present

## 2018-04-23 DIAGNOSIS — Z9189 Other specified personal risk factors, not elsewhere classified: Secondary | ICD-10-CM

## 2018-04-23 DIAGNOSIS — I10 Essential (primary) hypertension: Secondary | ICD-10-CM

## 2018-04-23 MED ORDER — VITAMIN D (ERGOCALCIFEROL) 1.25 MG (50000 UNIT) PO CAPS: 50000.0000 [IU] | ORAL_CAPSULE | ORAL | 0 refills | Status: DC

## 2018-04-23 NOTE — Progress Notes (Signed)
Office: [phone]  /  Fax: [phone]   HPI:   Chief Complaint: OBESITY Natasha [name] is here to discuss her progress with her obesity treatment plan. She is on the Category 3 plan and is following her eating plan approximately 90 % of the time. She states she is exercising 0 minutes 0 times per week. Natasha [name] is planning on going to the beach this weekend. Still struggling with eating all food especially at work and finds herself sometimes supplementing with protein bar and 1/2 sandwich.  Her weight is 220 lb (99.8 kg) today and has had a weight loss of 6 pounds over a period of 3 to 4 weeks since her last visit. She has lost 21 lbs since starting treatment with [place].  Hypertension Natasha [name] is a 57 y.o. female with hypertension. Natasha [name]'s blood pressure is uncontrolled today. She denies chest pain, chest pressure, or headache. She hasn't been taking medications. She is working weight loss to help control her blood pressure with the goal of decreasing her risk of heart attack and stroke.   Vitamin D Deficiency Natasha [name] has a diagnosis of vitamin D deficiency. She is currently taking prescription Vit D. She notes fatigue and denies nausea, vomiting or muscle weakness.  At risk for osteopenia and osteoporosis Natasha [name] is at higher risk of osteopenia and osteoporosis due to vitamin D deficiency.   ALLERGIES: Allergies  Allergen Reactions  . Codeine Nausea Only    MEDICATIONS: Current Outpatient Medications on File Prior to Visit  Medication Sig Dispense Refill  . aspirin 81 MG tablet Take 81 mg by mouth daily.    [name] lisinopril (PRINIVIL,ZESTRIL) 5 MG tablet Take 1 tablet (5 mg total) by mouth daily. 90 tablet 3   No current facility-administered medications on file prior to visit.     PAST MEDICAL HISTORY: Past Medical History:  Diagnosis Date  . Allergy   . Anemia   . Back pain   . Cancer ([place])   . HTN (hypertension)   . SOB (shortness of breath) on exertion   . Spinal stenosis   .  Torn meniscus     PAST SURGICAL HISTORY: Past Surgical History:  Procedure Laterality Date  . CESAREAN SECTION    . KNEE ARTHROSCOPY  12/20/2017   left knee  . TONSILLECTOMY  1984    SOCIAL HISTORY: Social History   Tobacco Use  . Smoking status: Never Smoker  . Smokeless tobacco: Never Used  Substance Use Topics  . Alcohol use: No  . Drug use: No    FAMILY HISTORY: Family History  Problem Relation Age of Onset  . High blood pressure Mother   . High Cholesterol Mother   . Heart disease Mother   . Cancer Father   . High blood pressure Father     ROS: Review of Systems  Constitutional: Positive for malaise/fatigue and weight loss.  Cardiovascular: Negative for chest pain.       Negative chest pressure  Gastrointestinal: Negative for nausea and vomiting.  Musculoskeletal:       Negative muscle weakness  Neurological: Negative for headaches.    PHYSICAL EXAM: Pulse 67, temperature 98 F (36.7 C), temperature source Oral, height 5\' 3"  (1.6 m), weight 220 lb (99.8 kg), SpO2 99 %. Body mass index is 38.97 kg/m. Physical Exam  Constitutional: She is oriented to person, place, and time. She appears well-developed and well-nourished.  Cardiovascular: Normal rate.  Pulmonary/Chest: Effort normal.  Musculoskeletal: Normal range of motion.  Neurological: She is oriented  to person, place, and time.  Skin: Skin is warm and dry.  Psychiatric: She has a normal mood and affect. Her behavior is normal.  Vitals reviewed.   RECENT LABS AND TESTS: BMET    Component Value Date/Time   NA 141 02/12/2018 1237   K 4.2 02/12/2018 1237   CL 103 02/12/2018 1237   CO2 23 02/12/2018 1237   GLUCOSE 93 02/12/2018 1237   GLUCOSE 111 (H) 12/11/2017 1001   BUN 13 02/12/2018 1237   CREATININE 0.66 02/12/2018 1237   CREATININE 0.70 10/02/2013 1736   CALCIUM 9.7 02/12/2018 1237   GFRNONAA 99 02/12/2018 1237   GFRAA 114 02/12/2018 1237   Lab Results  Component Value Date    HGBA1C 5.5 02/12/2018   HGBA1C 5.8 12/11/2017   HGBA1C 5.7 04/27/2016   Lab Results  Component Value Date   INSULIN 21.9 02/12/2018   CBC    Component Value Date/Time   WBC 5.1 12/11/2017 1001   RBC 5.02 12/11/2017 1001   HGB 14.0 12/11/2017 1001   HCT 42.3 12/11/2017 1001   PLT 172.0 12/11/2017 1001   MCV 84.2 12/11/2017 1001   MCV 88.4 10/02/2013 1736   MCH 27.1 10/02/2013 1736   MCHC 33.2 12/11/2017 1001   RDW 13.3 12/11/2017 1001   Iron/TIBC/Ferritin/ %Sat No results found for: IRON, TIBC, FERRITIN, IRONPCTSAT Lipid Panel     Component Value Date/Time   CHOL 189 12/11/2017 1001   TRIG 85.0 12/11/2017 1001   HDL 44.00 12/11/2017 1001   CHOLHDL 4 12/11/2017 1001   VLDL 17.0 12/11/2017 1001   LDLCALC 128 (H) 12/11/2017 1001   Hepatic Function Panel     Component Value Date/Time   PROT 7.1 02/12/2018 1237   ALBUMIN 4.4 02/12/2018 1237   AST 29 02/12/2018 1237   ALT 32 02/12/2018 1237   ALKPHOS 83 02/12/2018 1237   BILITOT 0.6 02/12/2018 1237      Component Value Date/Time   TSH 2.040 02/12/2018 1237   TSH 2.64 04/27/2016 1547  Results for ILYANNA, BAILLARGEON (MRN [id number]) as of 04/23/2018 10:32  Ref. Range 02/12/2018 12:37  Vitamin D, 25-Hydroxy Latest Ref Range: 30.0 - 100.0 ng/mL 15.3 (L)    ASSESSMENT AND PLAN: Essential hypertension  Vitamin D deficiency - Plan: Vitamin D, Ergocalciferol, (DRISDOL) 50000 units CAPS capsule  At risk for osteoporosis  Class 2 severe obesity with serious comorbidity and body mass index (BMI) of 39.0 to 39.9 in adult, unspecified obesity type (Natasha [name])  PLAN:  Hypertension We discussed sodium restriction, working on healthy weight loss, and a regular exercise program as the means to achieve improved blood pressure control. Natasha [name] agreed with this plan and agreed to follow up as directed. We will continue to monitor her blood pressure as well as her progress with the above lifestyle modifications. Natasha [name] agrees to start lisinopril 5  mg PO daily and she will watch for signs of hypotension as she continues her lifestyle modifications. Natasha [name] agrees to follow up with our clinic in 2 weeks.  Vitamin D Deficiency Beverlee was informed that low vitamin D levels contributes to fatigue and are associated with obesity, breast, and colon cancer. Natasha [name] agrees to continue taking prescription Vit D @50 ,000 IU every week #4 and we will refill for 1 month. She will follow up for routine testing of vitamin D, at least 2-3 times per year. She was informed of the risk of over-replacement of vitamin D and agrees to not increase her dose unless she discusses this  with [place] first. Natasha [name] agrees to follow up with our clinic in 2 weeks.  At risk for osteopenia and osteoporosis Natasha [name] is at risk for osteopenia and osteoporsis due to her vitamin D deficiency. She was encouraged to take her vitamin D and follow her higher calcium diet and increase strengthening exercise to help strengthen her bones and decrease her risk of osteopenia and osteoporosis.  Obesity Natasha [name] is currently in the action stage of change. As such, her goal is to continue with weight loss efforts She has agreed to follow the Category 3 plan Natasha [name] has been instructed to work up to a goal of 150 minutes of combined cardio and strengthening exercise per week for weight loss and overall health benefits. We will discuss resistance training at next visit. We discussed the following Behavioral Modification Strategies today: increasing lean protein intake, increasing vegetables, work on meal planning and easy cooking plans, and planning for success   Natasha [name] has agreed to follow up with our clinic in 2 weeks. She was informed of the importance of frequent follow up visits to maximize her success with intensive lifestyle modifications for her multiple health conditions.   OBESITY BEHAVIORAL INTERVENTION VISIT  Today's visit was # 5 out of 22.  Starting weight: 241 lbs Starting date:  02/12/18 Today's weight : 220 lbs  Today's date: 04/23/2018 Total lbs lost to date: 21 (Patients must lose 7 lbs in the first 6 months to continue with counseling)   ASK: We discussed the diagnosis of obesity with Natasha [name] today and Natasha [name] agreed to give [place] permission to discuss obesity behavioral modification therapy today.  ASSESS: Natasha [name] has the diagnosis of obesity and her BMI today is 38.98 Natasha [name] is in the action stage of change   ADVISE: Natasha [name] was educated on the multiple health risks of obesity as well as the benefit of weight loss to improve her health. She was advised of the need for long term treatment and the importance of lifestyle modifications.  AGREE: Multiple dietary modification options and treatment options were discussed and  Natasha [name] agreed to the above obesity treatment plan.  I, Trixie Dredge, am acting as transcriptionist for Ilene Qua, MD  I have reviewed the above documentation for accuracy and completeness, and I agree with the above. - Ilene Qua, MD

## 2018-05-10 ENCOUNTER — Ambulatory Visit (INDEPENDENT_AMBULATORY_CARE_PROVIDER_SITE_OTHER): Payer: BLUE CROSS/BLUE SHIELD | Admitting: Family Medicine

## 2018-05-16 ENCOUNTER — Ambulatory Visit (INDEPENDENT_AMBULATORY_CARE_PROVIDER_SITE_OTHER): Payer: BLUE CROSS/BLUE SHIELD | Admitting: Physician Assistant

## 2018-05-16 VITALS — BP 111/76 | HR 68 | Temp 98.1°F | Ht 63.0 in | Wt 217.0 lb

## 2018-05-16 DIAGNOSIS — Z6838 Body mass index (BMI) 38.0-38.9, adult: Secondary | ICD-10-CM | POA: Diagnosis not present

## 2018-05-16 DIAGNOSIS — E559 Vitamin D deficiency, unspecified: Secondary | ICD-10-CM | POA: Diagnosis not present

## 2018-05-16 NOTE — Progress Notes (Signed)
Office: [phone]  /  Fax: [phone]   HPI:   Chief Complaint: OBESITY Natasha [name] is here to discuss her progress with her obesity treatment plan. Natasha [name] is on the Category 3 plan and is following her eating plan approximately 90 % of the time. Natasha [name] states Natasha [name] is exercising 0 minutes 0 times per week. Natasha [name] continues to do well with weight loss. Natasha [name] has challenges with eating all the foods on the meal plan. Natasha [name] would like vacation eating strategies.  Her weight is 217 lb (98.4 kg) today and has had a weight loss of 3 pounds over a period of 3 to 4 weeks since her last visit. Natasha [name] has lost 24 lbs since starting treatment with [place].  Vitamin D Deficiency Natasha [name] has a diagnosis of vitamin D deficiency. Natasha [name] is currently taking prescription Vit D and denies nausea, vomiting or muscle weakness.  ALLERGIES: Allergies  Allergen Reactions  . Codeine Nausea Only    MEDICATIONS: Current Outpatient Medications on File Prior to Visit  Medication Sig Dispense Refill  . aspirin 81 MG tablet Take 81 mg by mouth daily.    [name] lisinopril (PRINIVIL,ZESTRIL) 5 MG tablet Take 1 tablet (5 mg total) by mouth daily. 90 tablet 3  . Vitamin D, Ergocalciferol, (DRISDOL) 50000 units CAPS capsule Take 1 capsule (50,000 Units total) by mouth every 7 (seven) days. 4 capsule 0   No current facility-administered medications on file prior to visit.     PAST MEDICAL HISTORY: Past Medical History:  Diagnosis Date  . Allergy   . Anemia   . Back pain   . Cancer ([place])   . HTN (hypertension)   . SOB (shortness of breath) on exertion   . Spinal stenosis   . Torn meniscus     PAST SURGICAL HISTORY: Past Surgical History:  Procedure Laterality Date  . CESAREAN SECTION    . KNEE ARTHROSCOPY  12/20/2017   left knee  . TONSILLECTOMY  1984    SOCIAL HISTORY: Social History   Tobacco Use  . Smoking status: Never Smoker  . Smokeless tobacco: Never Used  Substance Use Topics  . Alcohol use: No  . Drug use: No     FAMILY HISTORY: Family History  Problem Relation Age of Onset  . High blood pressure Mother   . High Cholesterol Mother   . Heart disease Mother   . Cancer Father   . High blood pressure Father     ROS: Review of Systems  Constitutional: Positive for weight loss.  Gastrointestinal: Negative for nausea and vomiting.  Musculoskeletal:       Negative muscle weakness    PHYSICAL EXAM: Blood pressure 111/76, pulse 68, temperature 98.1 F (36.7 C), temperature source Oral, height 5\' 3"  (1.6 m), weight 217 lb (98.4 kg), SpO2 99 %. Body mass index is 38.44 kg/m. Physical Exam  Constitutional: Natasha [name] is oriented to person, place, and time. Natasha [name] appears well-developed and well-nourished.  Cardiovascular: Normal rate.  Pulmonary/Chest: Effort normal.  Musculoskeletal: Normal range of motion.  Neurological: Natasha [name] is oriented to person, place, and time.  Skin: Skin is warm and dry.  Psychiatric: Natasha [name] has a normal mood and affect. Her behavior is normal.  Vitals reviewed.   RECENT LABS AND TESTS: BMET    Component Value Date/Time   NA 141 02/12/2018 1237   K 4.2 02/12/2018 1237   CL 103 02/12/2018 1237   CO2 23 02/12/2018 1237   GLUCOSE 93 02/12/2018 1237   GLUCOSE 111 (H) 12/11/2017 1001  BUN 13 02/12/2018 1237   CREATININE 0.66 02/12/2018 1237   CREATININE 0.70 10/02/2013 1736   CALCIUM 9.7 02/12/2018 1237   GFRNONAA 99 02/12/2018 1237   GFRAA 114 02/12/2018 1237   Lab Results  Component Value Date   HGBA1C 5.5 02/12/2018   HGBA1C 5.8 12/11/2017   HGBA1C 5.7 04/27/2016   Lab Results  Component Value Date   INSULIN 21.9 02/12/2018   CBC    Component Value Date/Time   WBC 5.1 12/11/2017 1001   RBC 5.02 12/11/2017 1001   HGB 14.0 12/11/2017 1001   HCT 42.3 12/11/2017 1001   PLT 172.0 12/11/2017 1001   MCV 84.2 12/11/2017 1001   MCV 88.4 10/02/2013 1736   MCH 27.1 10/02/2013 1736   MCHC 33.2 12/11/2017 1001   RDW 13.3 12/11/2017 1001   Iron/TIBC/Ferritin/  %Sat No results found for: IRON, TIBC, FERRITIN, IRONPCTSAT Lipid Panel     Component Value Date/Time   CHOL 189 12/11/2017 1001   TRIG 85.0 12/11/2017 1001   HDL 44.00 12/11/2017 1001   CHOLHDL 4 12/11/2017 1001   VLDL 17.0 12/11/2017 1001   LDLCALC 128 (H) 12/11/2017 1001   Hepatic Function Panel     Component Value Date/Time   PROT 7.1 02/12/2018 1237   ALBUMIN 4.4 02/12/2018 1237   AST 29 02/12/2018 1237   ALT 32 02/12/2018 1237   ALKPHOS 83 02/12/2018 1237   BILITOT 0.6 02/12/2018 1237      Component Value Date/Time   TSH 2.040 02/12/2018 1237   TSH 2.64 04/27/2016 1547  Results for Natasha [name], Natasha [name] (MRN [id number]) as of 05/16/2018 15:50  Ref. Range 02/12/2018 12:37  Vitamin D, 25-Hydroxy Latest Ref Range: 30.0 - 100.0 ng/mL 15.3 (L)    ASSESSMENT AND PLAN: Vitamin D deficiency  Class 2 severe obesity with serious comorbidity and body mass index (BMI) of 38.0 to 38.9 in adult, unspecified obesity type ([place])  PLAN:  Vitamin D Deficiency Natasha [name] was informed that low vitamin D levels contributes to fatigue and are associated with obesity, breast, and colon cancer. Natasha [name] agrees to continue taking prescription Vit D @50 ,000 IU every week and will follow up for routine testing of vitamin D, at least 2-3 times per year. Natasha [name] was informed of the risk of over-replacement of vitamin D and agrees to not increase her dose unless Natasha [name] discusses this with [place] first. Natasha [name] agrees to follow up with our clinic in 2 weeks.  We spent > than 50% of the 15 minute visit on the counseling as documented in the note.  Obesity Natasha [name] is currently in the action stage of change. As such, her goal is to continue with weight loss efforts Natasha [name] has agreed to portion control better and make smarter food choices, such as increase vegetables and decrease simple carbohydrates  Natasha [name] has been instructed to work up to a goal of 150 minutes of combined cardio and strengthening exercise per week for weight loss and  overall health benefits. We discussed the following Behavioral Modification Strategies today: travel eating strategies, increase H20 intake, and no skipping meals    Tawnya has agreed to follow up with our clinic in 2 weeks. Natasha [name] was informed of the importance of frequent follow up visits to maximize her success with intensive lifestyle modifications for her multiple health conditions.   OBESITY BEHAVIORAL INTERVENTION VISIT  Today's visit was # 6 out of 22.  Starting weight: 241 lbs Starting date: 02/12/18 Today's weight : 217 lbs Today's date: 05/16/2018 Total lbs lost  to date: 30 (Patients must lose 7 lbs in the first 6 months to continue with counseling)   ASK: We discussed the diagnosis of obesity with Esmeralda Arthur today and Nielle agreed to give [place] permission to discuss obesity behavioral modification therapy today.  ASSESS: Dawson has the diagnosis of obesity and her BMI today is 38.45 Cadynce is in the action stage of change   ADVISE: Tanay was educated on the multiple health risks of obesity as well as the benefit of weight loss to improve her health. Natasha [name] was advised of the need for long term treatment and the importance of lifestyle modifications.  AGREE: Multiple dietary modification options and treatment options were discussed and  Shary agreed to the above obesity treatment plan.   Wilhemena Durie, am acting as transcriptionist for Lacy Duverney, PA-C I, Lacy Duverney [facility], have reviewed this note and agree with its content

## 2018-05-29 ENCOUNTER — Other Ambulatory Visit (INDEPENDENT_AMBULATORY_CARE_PROVIDER_SITE_OTHER): Payer: Self-pay | Admitting: Family Medicine

## 2018-05-29 DIAGNOSIS — E559 Vitamin D deficiency, unspecified: Secondary | ICD-10-CM

## 2018-06-07 ENCOUNTER — Ambulatory Visit (INDEPENDENT_AMBULATORY_CARE_PROVIDER_SITE_OTHER): Payer: BLUE CROSS/BLUE SHIELD | Admitting: Physician Assistant

## 2018-06-07 VITALS — BP 146/81 | HR 64 | Temp 97.9°F | Ht 63.0 in | Wt 213.0 lb

## 2018-06-07 DIAGNOSIS — Z9189 Other specified personal risk factors, not elsewhere classified: Secondary | ICD-10-CM

## 2018-06-07 DIAGNOSIS — I10 Essential (primary) hypertension: Secondary | ICD-10-CM

## 2018-06-07 DIAGNOSIS — E559 Vitamin D deficiency, unspecified: Secondary | ICD-10-CM | POA: Diagnosis not present

## 2018-06-07 DIAGNOSIS — Z6837 Body mass index (BMI) 37.0-37.9, adult: Secondary | ICD-10-CM | POA: Diagnosis not present

## 2018-06-07 MED ORDER — VITAMIN D (ERGOCALCIFEROL) 1.25 MG (50000 UNIT) PO CAPS: 50000.0000 [IU] | ORAL_CAPSULE | ORAL | 0 refills | Status: DC

## 2018-06-07 NOTE — Progress Notes (Signed)
Office: [phone]  /  Fax: [phone]   HPI:   Chief Complaint: OBESITY Natasha [name] is here to discuss her progress with her obesity treatment plan. She is on the portion control better and make smarter food choices, such as increase vegetables and decrease simple carbohydrates and is following her eating plan approximately 50 % of the time. She states she is exercising 0 minutes 0 times per week. Jadon continues to do well with weight loss. She was on vacation and managed to make better food choices. She requests a longer follow up time as she will be on vacation.  Her weight is 213 lb (96.6 kg) today and has had a weight loss of 4 pounds over a period of 3 weeks since her last visit. She has lost 28 lbs since starting treatment with [place].  Hypertension Natasha [name] is a 57 y.o. female with hypertension. Carlin's blood pressure is elevated. She states she has not taken her blood pressure medications all last week. Medication compliance addressed with patient. She denies chest pain or shortness of breath. She is working weight loss to help control her blood pressure with the goal of decreasing her risk of heart attack and stroke. Natasha [name]'s blood pressure is not currently controlled.  At risk for cardiovascular disease Natasha [name] is at a higher than average risk for cardiovascular disease due to obesity and hypertension. She currently denies any chest pain.  Vitamin D Deficiency Natasha [name] has a diagnosis of vitamin D deficiency. She is currently taking prescription Vit D and denies nausea, vomiting or muscle weakness.  ALLERGIES: Allergies  Allergen Reactions  . Codeine Nausea Only    MEDICATIONS: Current Outpatient Medications on File Prior to Visit  Medication Sig Dispense Refill  . aspirin 81 MG tablet Take 81 mg by mouth daily.    [name] lisinopril (PRINIVIL,ZESTRIL) 5 MG tablet Take 1 tablet (5 mg total) by mouth daily. 90 tablet 3  . Vitamin D, Ergocalciferol, (DRISDOL) 50000 units CAPS capsule  Take 1 capsule (50,000 Units total) by mouth every 7 (seven) days. 4 capsule 0   No current facility-administered medications on file prior to visit.     PAST MEDICAL HISTORY: Past Medical History:  Diagnosis Date  . Allergy   . Anemia   . Back pain   . Cancer ([place])   . HTN (hypertension)   . SOB (shortness of breath) on exertion   . Spinal stenosis   . Torn meniscus     PAST SURGICAL HISTORY: Past Surgical History:  Procedure Laterality Date  . CESAREAN SECTION    . KNEE ARTHROSCOPY  12/20/2017   left knee  . TONSILLECTOMY  1984    SOCIAL HISTORY: Social History   Tobacco Use  . Smoking status: Never Smoker  . Smokeless tobacco: Never Used  Substance Use Topics  . Alcohol use: No  . Drug use: No    FAMILY HISTORY: Family History  Problem Relation Age of Onset  . High blood pressure Mother   . High Cholesterol Mother   . Heart disease Mother   . Cancer Father   . High blood pressure Father     ROS: Review of Systems  Constitutional: Positive for weight loss.  Respiratory: Negative for shortness of breath.   Cardiovascular: Negative for chest pain.  Gastrointestinal: Negative for nausea and vomiting.  Musculoskeletal:       Negative muscle weakness    PHYSICAL EXAM: Blood pressure (!) 146/81, pulse 64, temperature 97.9 F (36.6 C), temperature source Oral,  height 5\' 3"  (1.6 m), weight 213 lb (96.6 kg), SpO2 99 %. Body mass index is 37.73 kg/m. Physical Exam  Constitutional: She is oriented to person, place, and time. She appears well-developed and well-nourished.  Cardiovascular: Normal rate.  Pulmonary/Chest: Effort normal.  Musculoskeletal: Normal range of motion.  Neurological: She is oriented to person, place, and time.  Skin: Skin is warm and dry.  Psychiatric: She has a normal mood and affect. Her behavior is normal.  Vitals reviewed.   RECENT LABS AND TESTS: BMET    Component Value Date/Time   NA 141 02/12/2018 1237   K 4.2  02/12/2018 1237   CL 103 02/12/2018 1237   CO2 23 02/12/2018 1237   GLUCOSE 93 02/12/2018 1237   GLUCOSE 111 (H) 12/11/2017 1001   BUN 13 02/12/2018 1237   CREATININE 0.66 02/12/2018 1237   CREATININE 0.70 10/02/2013 1736   CALCIUM 9.7 02/12/2018 1237   GFRNONAA 99 02/12/2018 1237   GFRAA 114 02/12/2018 1237   Lab Results  Component Value Date   HGBA1C 5.5 02/12/2018   HGBA1C 5.8 12/11/2017   HGBA1C 5.7 04/27/2016   Lab Results  Component Value Date   INSULIN 21.9 02/12/2018   CBC    Component Value Date/Time   WBC 5.1 12/11/2017 1001   RBC 5.02 12/11/2017 1001   HGB 14.0 12/11/2017 1001   HCT 42.3 12/11/2017 1001   PLT 172.0 12/11/2017 1001   MCV 84.2 12/11/2017 1001   MCV 88.4 10/02/2013 1736   MCH 27.1 10/02/2013 1736   MCHC 33.2 12/11/2017 1001   RDW 13.3 12/11/2017 1001   Iron/TIBC/Ferritin/ %Sat No results found for: IRON, TIBC, FERRITIN, IRONPCTSAT Lipid Panel     Component Value Date/Time   CHOL 189 12/11/2017 1001   TRIG 85.0 12/11/2017 1001   HDL 44.00 12/11/2017 1001   CHOLHDL 4 12/11/2017 1001   VLDL 17.0 12/11/2017 1001   LDLCALC 128 (H) 12/11/2017 1001   Hepatic Function Panel     Component Value Date/Time   PROT 7.1 02/12/2018 1237   ALBUMIN 4.4 02/12/2018 1237   AST 29 02/12/2018 1237   ALT 32 02/12/2018 1237   ALKPHOS 83 02/12/2018 1237   BILITOT 0.6 02/12/2018 1237      Component Value Date/Time   TSH 2.040 02/12/2018 1237   TSH 2.64 04/27/2016 1547  Results for Natasha, [name] (MRN [id number]) as of 06/07/2018 11:11  Ref. Range 02/12/2018 12:37  Vitamin D, 25-Hydroxy Latest Ref Range: 30.0 - 100.0 ng/mL 15.3 (L)    ASSESSMENT AND PLAN: Essential hypertension  Vitamin D deficiency - Plan: Vitamin D, Ergocalciferol, (DRISDOL) 50000 units CAPS capsule  At risk for heart disease  Class 2 severe obesity with serious comorbidity and body mass index (BMI) of 37.0 to 37.9 in adult, unspecified obesity type  ([place])  PLAN:  Hypertension We discussed sodium restriction, working on healthy weight loss, and a regular exercise program as the means to achieve improved blood pressure control. Ia agreed with this plan and agreed to follow up as directed. We will continue to monitor her blood pressure as well as her progress with the above lifestyle modifications. Tonique agrees to continue her medications and will watch for signs of hypotension as she continues her lifestyle modifications. Nala agrees to follow up with our clinic in 4 weeks.  Cardiovascular risk counselling Jariana was given extended (15 minutes) coronary artery disease prevention counseling today. She is 57 y.o. female and has risk factors for heart disease including obesity  and hypertension. We discussed intensive lifestyle modifications today with an emphasis on specific weight loss instructions and strategies. Pt was also informed of the importance of increasing exercise and decreasing saturated fats to help prevent heart disease.  Vitamin D Deficiency Fariha was informed that low vitamin D levels contributes to fatigue and are associated with obesity, breast, and colon cancer. Radonna agrees to continue taking prescription Vit D @50 ,000 IU every week #4 and we will refill for 1 month. She will follow up for routine testing of vitamin D, at least 2-3 times per year. She was informed of the risk of over-replacement of vitamin D and agrees to not increase her dose unless she discusses this with [place] first. Andres agrees to follow up with our clinic in 4 weeks.  Obesity Milana is currently in the action stage of change. As such, her goal is to continue with weight loss efforts She has agreed to follow the Category 3 plan + 100 calories Dena has been instructed to work up to a goal of 150 minutes of combined cardio and strengthening exercise per week for weight loss and overall health benefits. We discussed the following Behavioral Modification  Strategies today: travel eating strategies and emotional eating strategies   Shaquera has agreed to follow up with our clinic in 4 weeks. She was informed of the importance of frequent follow up visits to maximize her success with intensive lifestyle modifications for her multiple health conditions.   OBESITY BEHAVIORAL INTERVENTION VISIT  Today's visit was # 7 out of 22.  Starting weight: 241 lbs Starting date: 02/12/18 Today's weight : 213 lbs  Today's date: 06/07/2018 Total lbs lost to date: 60 (Patients must lose 7 lbs in the first 6 months to continue with counseling)   ASK: We discussed the diagnosis of obesity with Esmeralda Arthur today and Jurline agreed to give [place] permission to discuss obesity behavioral modification therapy today.  ASSESS: Marnita has the diagnosis of obesity and her BMI today is 37.74 Aynsley is in the action stage of change   ADVISE: Niani was educated on the multiple health risks of obesity as well as the benefit of weight loss to improve her health. She was advised of the need for long term treatment and the importance of lifestyle modifications.  AGREE: Multiple dietary modification options and treatment options were discussed and  Rettie agreed to the above obesity treatment plan.   Wilhemena Durie, am acting as transcriptionist for Lacy Duverney, PA-C I, Lacy Duverney [facility], have reviewed this note and agree with its content

## 2018-07-05 ENCOUNTER — Ambulatory Visit (INDEPENDENT_AMBULATORY_CARE_PROVIDER_SITE_OTHER): Payer: BLUE CROSS/BLUE SHIELD | Admitting: Physician Assistant

## 2018-07-05 VITALS — BP 145/76 | HR 66 | Temp 97.7°F | Ht 63.0 in | Wt 210.0 lb

## 2018-07-05 DIAGNOSIS — Z9189 Other specified personal risk factors, not elsewhere classified: Secondary | ICD-10-CM | POA: Diagnosis not present

## 2018-07-05 DIAGNOSIS — E7849 Other hyperlipidemia: Secondary | ICD-10-CM | POA: Diagnosis not present

## 2018-07-05 DIAGNOSIS — E8881 Metabolic syndrome: Secondary | ICD-10-CM

## 2018-07-05 DIAGNOSIS — E559 Vitamin D deficiency, unspecified: Secondary | ICD-10-CM | POA: Diagnosis not present

## 2018-07-05 DIAGNOSIS — Z6837 Body mass index (BMI) 37.0-37.9, adult: Secondary | ICD-10-CM

## 2018-07-05 NOTE — Progress Notes (Signed)
Office: [phone]  /  Fax: [phone]   HPI:   Chief Complaint: OBESITY Natasha [name] is here to discuss her progress with her obesity treatment plan. She is on the Category 3 plan + 100 calories and is following her eating plan approximately 25 % of the time. She states she is exercising 0 minutes 0 times per week. [name] did very well with weight loss. She reports that she has not been following her plan closely as they have bought a beach house and has been there for a few weeks cleaning and fixing it up. She reports eating out more than normal.  Her weight is 210 lb (95.3 kg) today and has had a weight loss of 3 pounds over a period of 4 weeks since her last visit. She has lost 31 lbs since starting treatment with [place].  Vitamin D Deficiency Natasha [name] has a diagnosis of vitamin D deficiency. She is on prescription Vit D, but reports not taking it weekly. She denies nausea, vomiting or muscle weakness.  At risk for osteopenia and osteoporosis Natasha [name] is at higher risk of osteopenia and osteoporosis due to vitamin D deficiency.   Hyperlipidemia Natasha [name] has hyperlipidemia and has been trying to improve her cholesterol levels with intensive lifestyle modification including a low saturated fat diet, exercise and weight loss. She is not on medications and denies any chest pain, claudication or myalgias.  Insulin Resistance Natasha [name] has a diagnosis of insulin resistance based on her elevated fasting insulin level >5. Although Beaux's blood glucose readings are still under good control, insulin resistance puts her at greater risk of metabolic syndrome and diabetes. She is not taking metformin currently and continues to work on diet and exercise to decrease risk of diabetes. She denies polyphagia.  ALLERGIES: Allergies  Allergen Reactions  . Codeine Nausea Only    MEDICATIONS: Current Outpatient Medications on File Prior to Visit  Medication Sig Dispense Refill  . aspirin 81 MG tablet Take 81 mg by  mouth daily.    [name] lisinopril (PRINIVIL,ZESTRIL) 5 MG tablet Take 1 tablet (5 mg total) by mouth daily. 90 tablet 3  . Vitamin D, Ergocalciferol, (DRISDOL) 50000 units CAPS capsule Take 1 capsule (50,000 Units total) by mouth every 7 (seven) days. 4 capsule 0   No current facility-administered medications on file prior to visit.     PAST MEDICAL HISTORY: Past Medical History:  Diagnosis Date  . Allergy   . Anemia   . Back pain   . Cancer ([place])   . HTN (hypertension)   . SOB (shortness of breath) on exertion   . Spinal stenosis   . Torn meniscus     PAST SURGICAL HISTORY: Past Surgical History:  Procedure Laterality Date  . CESAREAN SECTION    . KNEE ARTHROSCOPY  12/20/2017   left knee  . TONSILLECTOMY  1984    SOCIAL HISTORY: Social History   Tobacco Use  . Smoking status: Never Smoker  . Smokeless tobacco: Never Used  Substance Use Topics  . Alcohol use: No  . Drug use: No    FAMILY HISTORY: Family History  Problem Relation Age of Onset  . High blood pressure Mother   . High Cholesterol Mother   . Heart disease Mother   . Cancer Father   . High blood pressure Father     ROS: Review of Systems  Constitutional: Positive for weight loss.  Cardiovascular: Negative for chest pain and claudication.  Gastrointestinal: Negative for nausea and vomiting.  Musculoskeletal: Negative for  myalgias.       Negative muscle weakness  Endo/Heme/Allergies:       Negative polyphagia    PHYSICAL EXAM: Blood pressure (!) 145/76, pulse 66, temperature 97.7 F (36.5 C), temperature source Oral, height 5\' 3"  (1.6 m), weight 210 lb (95.3 kg), SpO2 97 %. Body mass index is 37.2 kg/m. Physical Exam  Constitutional: She is oriented to person, place, and time. She appears well-developed and well-nourished.  Cardiovascular: Normal rate.  Pulmonary/Chest: Effort normal.  Musculoskeletal: Normal range of motion.  Neurological: She is oriented to person, place, and time.  Skin:  Skin is warm and dry.  Psychiatric: She has a normal mood and affect. Her behavior is normal.  Vitals reviewed.   RECENT LABS AND TESTS: BMET    Component Value Date/Time   NA 141 02/12/2018 1237   K 4.2 02/12/2018 1237   CL 103 02/12/2018 1237   CO2 23 02/12/2018 1237   GLUCOSE 93 02/12/2018 1237   GLUCOSE 111 (H) 12/11/2017 1001   BUN 13 02/12/2018 1237   CREATININE 0.66 02/12/2018 1237   CREATININE 0.70 10/02/2013 1736   CALCIUM 9.7 02/12/2018 1237   GFRNONAA 99 02/12/2018 1237   GFRAA 114 02/12/2018 1237   Lab Results  Component Value Date   HGBA1C 5.5 02/12/2018   HGBA1C 5.8 12/11/2017   HGBA1C 5.7 04/27/2016   Lab Results  Component Value Date   INSULIN 21.9 02/12/2018   CBC    Component Value Date/Time   WBC 5.1 12/11/2017 1001   RBC 5.02 12/11/2017 1001   HGB 14.0 12/11/2017 1001   HCT 42.3 12/11/2017 1001   PLT 172.0 12/11/2017 1001   MCV 84.2 12/11/2017 1001   MCV 88.4 10/02/2013 1736   MCH 27.1 10/02/2013 1736   MCHC 33.2 12/11/2017 1001   RDW 13.3 12/11/2017 1001   Iron/TIBC/Ferritin/ %Sat No results found for: IRON, TIBC, FERRITIN, IRONPCTSAT Lipid Panel     Component Value Date/Time   CHOL 189 12/11/2017 1001   TRIG 85.0 12/11/2017 1001   HDL 44.00 12/11/2017 1001   CHOLHDL 4 12/11/2017 1001   VLDL 17.0 12/11/2017 1001   LDLCALC 128 (H) 12/11/2017 1001   Hepatic Function Panel     Component Value Date/Time   PROT 7.1 02/12/2018 1237   ALBUMIN 4.4 02/12/2018 1237   AST 29 02/12/2018 1237   ALT 32 02/12/2018 1237   ALKPHOS 83 02/12/2018 1237   BILITOT 0.6 02/12/2018 1237      Component Value Date/Time   TSH 2.040 02/12/2018 1237   TSH 2.64 04/27/2016 1547  Results for NABILA, ALBARRACIN (MRN [id number]) as of 07/05/2018 10:06  Ref. Range 02/12/2018 12:37  Vitamin D, 25-Hydroxy Latest Ref Range: 30.0 - 100.0 ng/mL 15.3 (L)    ASSESSMENT AND PLAN: Vitamin D deficiency - Plan: VITAMIN D 25 Hydroxy (Vit-D Deficiency, Fractures)  Other  hyperlipidemia - Plan: Lipid Panel With LDL/HDL Ratio  Insulin resistance - Plan: Comprehensive metabolic panel, Hemoglobin A1c, Insulin, random  At risk for osteoporosis  Class 2 severe obesity with serious comorbidity and body mass index (BMI) of 37.0 to 37.9 in adult, unspecified obesity type ([place])  PLAN:  Vitamin D Deficiency Caraline was informed that low vitamin D levels contributes to fatigue and are associated with obesity, breast, and colon cancer. Arcadia agrees to continue taking prescription Vit D @50 ,000 IU every week and will follow up for routine testing of vitamin D, at least 2-3 times per year. She was informed of the risk of  over-replacement of vitamin D and agrees to not increase her dose unless she discusses this with [place] first. We will check labs and Akiko agrees to follow up with our clinic in 4 weeks.  At risk for osteopenia and osteoporosis Capucine is at risk for osteopenia and osteoporsis due to her vitamin D deficiency. She was encouraged to take her vitamin D and follow her higher calcium diet and increase strengthening exercise to help strengthen her bones and decrease her risk of osteopenia and osteoporosis.  Hyperlipidemia Darika was informed of the American Heart Association Guidelines emphasizing intensive lifestyle modifications as the first line treatment for hyperlipidemia. We discussed many lifestyle modifications today in depth, and Alea will continue to work on decreasing saturated fats such as fatty red meat, butter and many fried foods. She will also increase vegetables and lean protein in her diet and continue to work on diet, exercise, and weight loss efforts. We will check labs and Emonnie agrees to follow up with our clinic in 4 weeks.  Insulin Resistance Ryker will continue to work on weight loss, diet, exercise, and decreasing simple carbohydrates in her diet to help decrease the risk of diabetes. We dicussed metformin including benefits and risks. She was  informed that eating too many simple carbohydrates or too many calories at one sitting increases the likelihood of GI side effects. Shealeigh declined metformin for now and prescription was not written today. We will check labs and Alannis agrees to follow up with our clinic in 4 weeks as directed to monitor her progress.  Obesity Macayla is currently in the action stage of change. As such, her goal is to continue with weight loss efforts She has agreed to follow the Category 3 plan + 100 calories [place] has been instructed to work up to a goal of 150 minutes of combined cardio and strengthening exercise per week for weight loss and overall health benefits. We discussed the following Behavioral Modification Strategies today: work on meal planning and easy cooking plans and no skipping meals   Ahjanae has agreed to follow up with our clinic in 4 weeks. She was informed of the importance of frequent follow up visits to maximize her success with intensive lifestyle modifications for her multiple health conditions.   OBESITY BEHAVIORAL INTERVENTION VISIT  Today's visit was # 8 out of 22.  Starting weight: 241 lbs Starting date: 02/12/18 Today's weight : 210 lbs  Today's date: 07/05/2018 Total lbs lost to date: 35    ASK: We discussed the diagnosis of obesity with Esmeralda Arthur today and Kern Reap agreed to give [place] permission to discuss obesity behavioral modification therapy today.  ASSESS: Alleyne has the diagnosis of obesity and her BMI today is 37.21 Vaeda is in the action stage of change   ADVISE: Kiran was educated on the multiple health risks of obesity as well as the benefit of weight loss to improve her health. She was advised of the need for long term treatment and the importance of lifestyle modifications.  AGREE: Multiple dietary modification options and treatment options were discussed and  Fronie agreed to the above obesity treatment plan.  Wilhemena Durie, am acting as transcriptionist  for Abby Potash, PA-C I, Abby Potash, PA-C have reviewed above note and agree with its content

## 2018-07-06 LAB — COMPREHENSIVE METABOLIC PANEL
ALT: 26 IU/L (ref 0–32)
AST: 23 IU/L (ref 0–40)
Albumin/Globulin Ratio: 1.7 (ref 1.2–2.2)
Albumin: 4.2 g/dL (ref 3.5–5.5)
Alkaline Phosphatase: 75 IU/L (ref 39–117)
BUN/Creatinine Ratio: 24 — ABNORMAL HIGH (ref 9–23)
BUN: 17 mg/dL (ref 6–24)
Bilirubin Total: 0.8 mg/dL (ref 0.0–1.2)
CALCIUM: 9.3 mg/dL (ref 8.7–10.2)
CO2: 23 mmol/L (ref 20–29)
Chloride: 102 mmol/L (ref 96–106)
Creatinine, Ser: 0.71 mg/dL (ref 0.57–1.00)
GFR, EST AFRICAN AMERICAN: 109 mL/min/{1.73_m2} (ref >59)
GFR, EST NON AFRICAN AMERICAN: 95 mL/min/{1.73_m2} (ref >59)
GLUCOSE: 97 mg/dL (ref 65–99)
Globulin, Total: 2.5 g/dL (ref 1.5–4.5)
Potassium: 4.2 mmol/L (ref 3.5–5.2)
Sodium: 139 mmol/L (ref 134–144)
TOTAL PROTEIN: 6.7 g/dL (ref 6.0–8.5)

## 2018-07-06 LAB — HEMOGLOBIN A1C
ESTIMATED AVERAGE GLUCOSE: 105 mg/dL
Hgb A1c MFr Bld: 5.3 % (ref 4.8–5.6)

## 2018-07-06 LAB — LIPID PANEL WITH LDL/HDL RATIO
CHOLESTEROL TOTAL: 193 mg/dL (ref 100–199)
HDL: 52 mg/dL (ref >39)
LDL Calculated: 128 mg/dL — ABNORMAL HIGH (ref 0–99)
LDl/HDL Ratio: 2.5 ratio (ref 0.0–3.2)
Triglycerides: 63 mg/dL (ref 0–149)
VLDL CHOLESTEROL CAL: 13 mg/dL (ref 5–40)

## 2018-07-06 LAB — VITAMIN D 25 HYDROXY (VIT D DEFICIENCY, FRACTURES): VIT D 25 HYDROXY: 25.4 ng/mL — AB (ref 30.0–100.0)

## 2018-07-06 LAB — INSULIN, RANDOM: INSULIN: 11.9 u[IU]/mL (ref 2.6–24.9)

## 2018-08-06 ENCOUNTER — Ambulatory Visit (INDEPENDENT_AMBULATORY_CARE_PROVIDER_SITE_OTHER): Payer: BLUE CROSS/BLUE SHIELD | Admitting: Physician Assistant

## 2018-08-06 VITALS — BP 133/78 | HR 70 | Temp 98.0°F | Ht 63.0 in | Wt 210.0 lb

## 2018-08-06 DIAGNOSIS — E559 Vitamin D deficiency, unspecified: Secondary | ICD-10-CM | POA: Diagnosis not present

## 2018-08-06 DIAGNOSIS — Z6837 Body mass index (BMI) 37.0-37.9, adult: Secondary | ICD-10-CM | POA: Diagnosis not present

## 2018-08-07 NOTE — Progress Notes (Signed)
Office: [phone]  /  Fax: [phone]   HPI:   Chief Complaint: OBESITY Natasha [name] is here to discuss her progress with her obesity treatment plan. She is on the Category 3 plan + 100 calories and is following her eating plan approximately 50 % of the time. She states she is walking for 2 hours 2 times per week. [name] did well with weight maintenance. She reports traveling back and forth to the beach and was not able to completely stay on the plan. She is ready to get back on track.  Her weight is 210 lb (95.3 kg) today and has not lost weight since her last visit. She has lost 31 lbs since starting treatment with [place].  Vitamin D Deficiency [name] has a diagnosis of vitamin D deficiency. She is on prescription Vit D, last level is not at goal. She denies nausea, vomiting or muscle weakness.  ALLERGIES: Allergies  Allergen Reactions  . Codeine Nausea Only    MEDICATIONS: Current Outpatient Medications on File Prior to Visit  Medication Sig Dispense Refill  . aspirin 81 MG tablet Take 81 mg by mouth daily.    [name] lisinopril (PRINIVIL,ZESTRIL) 5 MG tablet Take 1 tablet (5 mg total) by mouth daily. 90 tablet 3  . Vitamin D, Ergocalciferol, (DRISDOL) 50000 units CAPS capsule Take 1 capsule (50,000 Units total) by mouth every 7 (seven) days. 4 capsule 0   No current facility-administered medications on file prior to visit.     PAST MEDICAL HISTORY: Past Medical History:  Diagnosis Date  . Allergy   . Anemia   . Back pain   . Cancer ([place])   . HTN (hypertension)   . SOB (shortness of breath) on exertion   . Spinal stenosis   . Torn meniscus     PAST SURGICAL HISTORY: Past Surgical History:  Procedure Laterality Date  . CESAREAN SECTION    . KNEE ARTHROSCOPY  12/20/2017   left knee  . TONSILLECTOMY  1984    SOCIAL HISTORY: Social History   Tobacco Use  . Smoking status: Never Smoker  . Smokeless tobacco: Never Used  Substance Use Topics  . Alcohol use: No  . Drug  use: No    FAMILY HISTORY: Family History  Problem Relation Age of Onset  . High blood pressure Mother   . High Cholesterol Mother   . Heart disease Mother   . Cancer Father   . High blood pressure Father     ROS: Review of Systems  Constitutional: Negative for weight loss.  Gastrointestinal: Negative for nausea and vomiting.  Musculoskeletal:       Negative muscle weakness    PHYSICAL EXAM: Blood pressure 133/78, pulse 70, temperature 98 F (36.7 C), temperature source Oral, height 5\' 3"  (1.6 m), weight 210 lb (95.3 kg), SpO2 99 %. Body mass index is 37.2 kg/m. Physical Exam  Constitutional: She is oriented to person, place, and time. She appears well-developed and well-nourished.  Cardiovascular: Normal rate.  Pulmonary/Chest: Effort normal.  Musculoskeletal: Normal range of motion.  Neurological: She is oriented to person, place, and time.  Skin: Skin is warm and dry.  Psychiatric: She has a normal mood and affect. Her behavior is normal.  Vitals reviewed.   RECENT LABS AND TESTS: BMET    Component Value Date/Time   NA 139 07/05/2018 0952   K 4.2 07/05/2018 0952   CL 102 07/05/2018 0952   CO2 23 07/05/2018 0952   GLUCOSE 97 07/05/2018 0952   GLUCOSE  111 (H) 12/11/2017 1001   BUN 17 07/05/2018 0952   CREATININE 0.71 07/05/2018 0952   CREATININE 0.70 10/02/2013 1736   CALCIUM 9.3 07/05/2018 0952   GFRNONAA 95 07/05/2018 0952   GFRAA 109 07/05/2018 0952   Lab Results  Component Value Date   HGBA1C 5.3 07/05/2018   HGBA1C 5.5 02/12/2018   HGBA1C 5.8 12/11/2017   HGBA1C 5.7 04/27/2016   Lab Results  Component Value Date   INSULIN 11.9 07/05/2018   INSULIN 21.9 02/12/2018   CBC    Component Value Date/Time   WBC 5.1 12/11/2017 1001   RBC 5.02 12/11/2017 1001   HGB 14.0 12/11/2017 1001   HCT 42.3 12/11/2017 1001   PLT 172.0 12/11/2017 1001   MCV 84.2 12/11/2017 1001   MCV 88.4 10/02/2013 1736   MCH 27.1 10/02/2013 1736   MCHC 33.2 12/11/2017  1001   RDW 13.3 12/11/2017 1001   Iron/TIBC/Ferritin/ %Sat No results found for: IRON, TIBC, FERRITIN, IRONPCTSAT Lipid Panel     Component Value Date/Time   CHOL 193 07/05/2018 0952   TRIG 63 07/05/2018 0952   HDL 52 07/05/2018 0952   CHOLHDL 4 12/11/2017 1001   VLDL 17.0 12/11/2017 1001   LDLCALC 128 (H) 07/05/2018 0952   Hepatic Function Panel     Component Value Date/Time   PROT 6.7 07/05/2018 0952   ALBUMIN 4.2 07/05/2018 0952   AST 23 07/05/2018 0952   ALT 26 07/05/2018 0952   ALKPHOS 75 07/05/2018 0952   BILITOT 0.8 07/05/2018 0952      Component Value Date/Time   TSH 2.040 02/12/2018 1237   TSH 2.64 04/27/2016 1547  Results for MALITA, IGNASIAK (MRN [id number]) as of 08/07/2018 13:13  Ref. Range 07/05/2018 09:52  Vitamin D, 25-Hydroxy Latest Ref Range: 30.0 - 100.0 ng/mL 25.4 (L)    ASSESSMENT AND PLAN: Vitamin D deficiency  Class 2 severe obesity with serious comorbidity and body mass index (BMI) of 37.0 to 37.9 in adult, unspecified obesity type ([place])  PLAN:  Vitamin D Deficiency Franchesca was informed that low vitamin D levels contributes to fatigue and are associated with obesity, breast, and colon cancer. Natasha [name] agrees to continue taking prescription Vit D @50 ,000 IU every week and will follow up for routine testing of vitamin D, at least 2-3 times per year. She was informed of the risk of over-replacement of vitamin D and agrees to not increase her dose unless she discusses this with [place] first. Yesly agrees to follow up with our clinic in 4 weeks.  I spent > than 50% of the 15 minute visit on counseling as documented in the note.  Obesity Shaira is currently in the action stage of change. As such, her goal is to continue with weight loss efforts She has agreed to keep a food journal with 1600 calories and 100 grams of protein daily and follow the Category 3 plan + 100 calories Theressa has been instructed to work up to a goal of 150 minutes of combined cardio and  strengthening exercise per week for weight loss and overall health benefits. We discussed the following Behavioral Modification Strategies today: work on meal planning and easy cooking plans and ways to avoid boredom eating Journaling numbers were given.  Danyeal has agreed to follow up with our clinic in 4 weeks. She was informed of the importance of frequent follow up visits to maximize her success with intensive lifestyle modifications for her multiple health conditions.   OBESITY BEHAVIORAL INTERVENTION VISIT  Today's  visit was # 9  Starting weight: 241 lbs Starting date: 02/12/18 Today's weight : 210 lbs Today's date: 08/06/2018 Total lbs lost to date: 28    ASK: We discussed the diagnosis of obesity with Esmeralda Arthur today and Kern Reap agreed to give [place] permission to discuss obesity behavioral modification therapy today.  ASSESS: Brookelle has the diagnosis of obesity and her BMI today is 37.21 Wana is in the action stage of change   ADVISE: Delane was educated on the multiple health risks of obesity as well as the benefit of weight loss to improve her health. She was advised of the need for long term treatment and the importance of lifestyle modifications.  AGREE: Multiple dietary modification options and treatment options were discussed and  Chris agreed to the above obesity treatment plan.  Wilhemena Durie, am acting as transcriptionist for Abby Potash, PA-C I, Abby Potash, PA-C have reviewed above note and agree with its content

## 2018-09-10 ENCOUNTER — Ambulatory Visit (INDEPENDENT_AMBULATORY_CARE_PROVIDER_SITE_OTHER): Payer: BLUE CROSS/BLUE SHIELD | Admitting: Family Medicine

## 2018-09-10 VITALS — BP 145/79 | HR 78 | Temp 98.2°F | Ht 63.0 in | Wt 210.0 lb

## 2018-09-10 DIAGNOSIS — I10 Essential (primary) hypertension: Secondary | ICD-10-CM | POA: Diagnosis not present

## 2018-09-10 DIAGNOSIS — Z6837 Body mass index (BMI) 37.0-37.9, adult: Secondary | ICD-10-CM

## 2018-09-12 NOTE — Progress Notes (Signed)
Office: [phone]  /  Fax: [phone]   HPI:   Chief Complaint: OBESITY Natasha [name] is here to discuss her progress with her obesity treatment plan. She is on the Category 3 plan and is following her eating plan approximately 25 % of the time. She states she [name] been walking a lot this month. Natasha [name] well maintaining her weight over the last 5 weeks while her son was getting married and she was out of town. She also notes an increase in sabotage at home.  Her weight is 210 lb (95.3 kg) today and [name] not lost weight since her last visit. She [name] lost 31 lbs since starting treatment with [place].  Hypertension Natasha [name] is a 57 y.o. female with hypertension. She is working on weight loss to help control her blood pressure with the goal of decreasing her risk of heart attack and stroke. Natasha [name] not been taking her medicine for last week blood pressure is not currently controlled. Natasha [name] denies chest pain or headaches  ALLERGIES: Allergies  Allergen Reactions  . Codeine Nausea Only    MEDICATIONS: Current Outpatient Medications on File Prior to Visit  Medication Sig Dispense Refill  . aspirin 81 MG tablet Take 81 mg by mouth daily.    [name] lisinopril (PRINIVIL,ZESTRIL) 5 MG tablet Take 1 tablet (5 mg total) by mouth daily. 90 tablet 3  . Vitamin D, Ergocalciferol, (DRISDOL) 50000 units CAPS capsule Take 1 capsule (50,000 Units total) by mouth every 7 (seven) days. 4 capsule 0   No current facility-administered medications on file prior to visit.     PAST MEDICAL HISTORY: Past Medical History:  Diagnosis Date  . Allergy   . Anemia   . Back pain   . Cancer ([place])   . HTN (hypertension)   . SOB (shortness of breath) on exertion   . Spinal stenosis   . Torn meniscus     PAST SURGICAL HISTORY: Past Surgical History:  Procedure Laterality Date  . CESAREAN SECTION    . KNEE ARTHROSCOPY  12/20/2017   left knee  . TONSILLECTOMY  1984    SOCIAL HISTORY: Social History    Tobacco Use  . Smoking status: Never Smoker  . Smokeless tobacco: Never Used  Substance Use Topics  . Alcohol use: No  . Drug use: No    FAMILY HISTORY: Family History  Problem Relation Age of Onset  . High blood pressure Mother   . High Cholesterol Mother   . Heart disease Mother   . Cancer Father   . High blood pressure Father     ROS: Review of Systems  Constitutional: Negative for weight loss.  Cardiovascular: Negative for chest pain.  Neurological: Negative for headaches.    PHYSICAL EXAM: Blood pressure (!) 145/79, pulse 78, temperature 98.2 F (36.8 C), temperature source Oral, height 5\' 3"  (1.6 m), weight 210 lb (95.3 kg), SpO2 98 %. Body mass index is 37.2 kg/m. Physical Exam  Constitutional: She is oriented to person, place, and time. She appears well-developed and well-nourished.  Cardiovascular: Normal rate.  Pulmonary/Chest: Effort normal.  Musculoskeletal: Normal range of motion.  Neurological: She is oriented to person, place, and time.  Skin: Skin is warm and dry.  Psychiatric: She [name] a normal mood and affect. Her behavior is normal.  Vitals reviewed.   RECENT LABS AND TESTS: BMET    Component Value Date/Time   NA 139 07/05/2018 0952   K 4.2 07/05/2018 0952   CL 102 07/05/2018 5277  CO2 23 07/05/2018 0952   GLUCOSE 97 07/05/2018 0952   GLUCOSE 111 (H) 12/11/2017 1001   BUN 17 07/05/2018 0952   CREATININE 0.71 07/05/2018 0952   CREATININE 0.70 10/02/2013 1736   CALCIUM 9.3 07/05/2018 0952   GFRNONAA 95 07/05/2018 0952   GFRAA 109 07/05/2018 0952   Lab Results  Component Value Date   HGBA1C 5.3 07/05/2018   HGBA1C 5.5 02/12/2018   HGBA1C 5.8 12/11/2017   HGBA1C 5.7 04/27/2016   Lab Results  Component Value Date   INSULIN 11.9 07/05/2018   INSULIN 21.9 02/12/2018   CBC    Component Value Date/Time   WBC 5.1 12/11/2017 1001   RBC 5.02 12/11/2017 1001   HGB 14.0 12/11/2017 1001   HCT 42.3 12/11/2017 1001   PLT 172.0  12/11/2017 1001   MCV 84.2 12/11/2017 1001   MCV 88.4 10/02/2013 1736   MCH 27.1 10/02/2013 1736   MCHC 33.2 12/11/2017 1001   RDW 13.3 12/11/2017 1001   Iron/TIBC/Ferritin/ %Sat No results found for: IRON, TIBC, FERRITIN, IRONPCTSAT Lipid Panel     Component Value Date/Time   CHOL 193 07/05/2018 0952   TRIG 63 07/05/2018 0952   HDL 52 07/05/2018 0952   CHOLHDL 4 12/11/2017 1001   VLDL 17.0 12/11/2017 1001   LDLCALC 128 (H) 07/05/2018 0952   Hepatic Function Panel     Component Value Date/Time   PROT 6.7 07/05/2018 0952   ALBUMIN 4.2 07/05/2018 0952   AST 23 07/05/2018 0952   ALT 26 07/05/2018 0952   ALKPHOS 75 07/05/2018 0952   BILITOT 0.8 07/05/2018 0952      Component Value Date/Time   TSH 2.040 02/12/2018 1237   TSH 2.64 04/27/2016 1547   Results for Natasha, [name] (MRN [id number]) as of 09/12/2018 07:13  Ref. Range 07/05/2018 09:52  Vitamin D, 25-Hydroxy Latest Ref Range: 30.0 - 100.0 ng/mL 25.4 (L)   ASSESSMENT AND PLAN: Essential hypertension  Class 2 severe obesity with serious comorbidity and body mass index (BMI) of 37.0 to 37.9 in adult, unspecified obesity type ([place])  PLAN:  Hypertension We discussed sodium restriction, working on healthy weight loss, and a regular exercise program as the means to achieve improved blood pressure control. We will continue to monitor her blood pressure as well as her progress with the above lifestyle modifications. She was encouraged to get back to her lisinopril every day. She was given a weekly pill box previously. She will continue her diet, exercise, and medications as prescribed. She will watch for signs of hypotension as she continues her lifestyle modifications. Natasha [name] with this plan and [name] to follow up as directed.  I spent > than 50% of the 15 minute visit on counseling as documented in the note.  Obesity Natasha [name] is currently in the action stage of change. As such, her goal is to continue with weight loss  efforts. She [name] [name] to keep a food journal with 1600 calories and 100 grams of protein. Natasha [name] [name] been instructed to work up to a goal of 150 minutes of combined cardio and strengthening exercise per week for weight loss and overall health benefits. We discussed the following Behavioral Modification Strategies today: increasing lean protein intake, decreasing simple carbohydrates, work on meal planning, and easy cooking plans and dealing with family or coworker sabotage.  Natasha [name] [name] [name] to follow up with our clinic in 2 weeks. She was informed of the importance of frequent follow up visits to maximize her success with intensive  lifestyle modifications for her multiple health conditions.   OBESITY BEHAVIORAL INTERVENTION VISIT  Today's visit was # 10  Starting weight: 241 lbs Starting date: 02/12/18 Today's weight : Weight: 210 lb (95.3 kg)  Today's date: 09/10/2018 Total lbs lost to date: 24  ASK: We discussed the diagnosis of obesity with Natasha [name] today and Natasha [name] [name] to give [place] permission to discuss obesity behavioral modification therapy today.  ASSESS: Natasha [name] [name] the diagnosis of obesity and her BMI today is 37.21. Natasha [name] is in the action stage of change.   ADVISE: Natasha [name] was educated on the multiple health risks of obesity as well as the benefit of weight loss to improve her health. She was advised of the need for long term treatment and the importance of lifestyle modifications to improve her current health and to decrease her risk of future health problems.  AGREE: Multiple dietary modification options and treatment options were discussed and Brynlie [name] to follow the recommendations documented in the above note.  ARRANGE: Natasha [name] was educated on the importance of frequent visits to treat obesity as outlined per CMS and USPSTF guidelines and [name] to schedule her next follow up appointment today.  I, Natasha [name], am acting as transcriptionist for Starlyn Skeans,  MD  I have reviewed the above documentation for accuracy and completeness, and I agree with the above. -Dennard Nip, MD

## 2018-09-26 ENCOUNTER — Encounter (INDEPENDENT_AMBULATORY_CARE_PROVIDER_SITE_OTHER): Payer: Self-pay | Admitting: Physician Assistant

## 2018-09-26 ENCOUNTER — Ambulatory Visit (INDEPENDENT_AMBULATORY_CARE_PROVIDER_SITE_OTHER): Payer: BLUE CROSS/BLUE SHIELD | Admitting: Physician Assistant

## 2018-09-26 VITALS — BP 138/77 | HR 60 | Temp 97.8°F | Ht 63.0 in | Wt 210.0 lb

## 2018-09-26 DIAGNOSIS — E559 Vitamin D deficiency, unspecified: Secondary | ICD-10-CM

## 2018-09-26 DIAGNOSIS — I10 Essential (primary) hypertension: Secondary | ICD-10-CM | POA: Diagnosis not present

## 2018-09-26 DIAGNOSIS — Z9189 Other specified personal risk factors, not elsewhere classified: Secondary | ICD-10-CM | POA: Diagnosis not present

## 2018-09-26 DIAGNOSIS — Z6837 Body mass index (BMI) 37.0-37.9, adult: Secondary | ICD-10-CM

## 2018-09-26 MED ORDER — VITAMIN D (ERGOCALCIFEROL) 1.25 MG (50000 UNIT) PO CAPS: 50000.0000 [IU] | ORAL_CAPSULE | ORAL | 0 refills | Status: DC

## 2018-09-26 NOTE — Progress Notes (Signed)
Office: [phone]  /  Fax: [phone]   HPI:   Chief Complaint: OBESITY Natasha [name] is here to discuss her progress with her obesity treatment plan. She is on the  keep a food journal with 1600 calories and 100 grams of protein daily and is following her eating plan approximately 80 % of the time. She states she is exercising 0 minutes 0 times per week. Natasha [name] reports that she is missing meals, due to her work schedule. Natasha [name] is not getting adequate protein or vegetables. Her weight is 210 lb (95.3 kg) today and she has maintained weight over a period of 2 weeks since her last visit. She has lost 31 lbs since starting treatment with [place].  Vitamin D deficiency Natasha [name] has a diagnosis of vitamin D deficiency. She is currently taking vit D and denies nausea, vomiting or muscle weakness.  At risk for osteopenia and osteoporosis Natasha [name] is at higher risk of osteopenia and osteoporosis due to vitamin D deficiency.   Hypertension Natasha [name] is a 57 y.o. female with hypertension. She is currently taking Lisinopril. Natasha [name] denies chest pain. She is working weight loss to help control her blood pressure with the goal of decreasing her risk of heart attack and stroke. Natasha [name] blood pressure is normal.  ALLERGIES: Allergies  Allergen Reactions  . Codeine Nausea Only    MEDICATIONS: Current Outpatient Medications on File Prior to Visit  Medication Sig Dispense Refill  . aspirin 81 MG tablet Take 81 mg by mouth daily.    [name] lisinopril (PRINIVIL,ZESTRIL) 5 MG tablet Take 1 tablet (5 mg total) by mouth daily. 90 tablet 3   No current facility-administered medications on file prior to visit.     PAST MEDICAL HISTORY: Past Medical History:  Diagnosis Date  . Allergy   . Anemia   . Back pain   . Cancer ([place])   . HTN (hypertension)   . SOB (shortness of breath) on exertion   . Spinal stenosis   . Torn meniscus     PAST SURGICAL HISTORY: Past Surgical History:  Procedure Laterality  Date  . CESAREAN SECTION    . KNEE ARTHROSCOPY  12/20/2017   left knee  . TONSILLECTOMY  1984    SOCIAL HISTORY: Social History   Tobacco Use  . Smoking status: Never Smoker  . Smokeless tobacco: Never Used  Substance Use Topics  . Alcohol use: No  . Drug use: No    FAMILY HISTORY: Family History  Problem Relation Age of Onset  . High blood pressure Mother   . High Cholesterol Mother   . Heart disease Mother   . Cancer Father   . High blood pressure Father     ROS: Review of Systems  Constitutional: Negative for weight loss.  Gastrointestinal: Negative for nausea and vomiting.  Musculoskeletal:       Negative for muscle weakness    PHYSICAL EXAM: Blood pressure 138/77, pulse 60, temperature 97.8 F (36.6 C), temperature source Oral, height 5\' 3"  (1.6 m), weight 210 lb (95.3 kg), SpO2 99 %. Body mass index is 37.2 kg/m. Physical Exam  Constitutional: She is oriented to person, place, and time. She appears well-developed and well-nourished.  Cardiovascular: Normal rate.  Pulmonary/Chest: Effort normal.  Musculoskeletal: Normal range of motion.  Neurological: She is oriented to person, place, and time.  Skin: Skin is warm and dry.  Psychiatric: She has a normal mood and affect. Her behavior is normal.  Vitals reviewed.   RECENT LABS  AND TESTS: BMET    Component Value Date/Time   NA 139 07/05/2018 0952   K 4.2 07/05/2018 0952   CL 102 07/05/2018 0952   CO2 23 07/05/2018 0952   GLUCOSE 97 07/05/2018 0952   GLUCOSE 111 (H) 12/11/2017 1001   BUN 17 07/05/2018 0952   CREATININE 0.71 07/05/2018 0952   CREATININE 0.70 10/02/2013 1736   CALCIUM 9.3 07/05/2018 0952   GFRNONAA 95 07/05/2018 0952   GFRAA 109 07/05/2018 0952   Lab Results  Component Value Date   HGBA1C 5.3 07/05/2018   HGBA1C 5.5 02/12/2018   HGBA1C 5.8 12/11/2017   HGBA1C 5.7 04/27/2016   Lab Results  Component Value Date   INSULIN 11.9 07/05/2018   INSULIN 21.9 02/12/2018   CBC      Component Value Date/Time   WBC 5.1 12/11/2017 1001   RBC 5.02 12/11/2017 1001   HGB 14.0 12/11/2017 1001   HCT 42.3 12/11/2017 1001   PLT 172.0 12/11/2017 1001   MCV 84.2 12/11/2017 1001   MCV 88.4 10/02/2013 1736   MCH 27.1 10/02/2013 1736   MCHC 33.2 12/11/2017 1001   RDW 13.3 12/11/2017 1001   Iron/TIBC/Ferritin/ %Sat No results found for: IRON, TIBC, FERRITIN, IRONPCTSAT Lipid Panel     Component Value Date/Time   CHOL 193 07/05/2018 0952   TRIG 63 07/05/2018 0952   HDL 52 07/05/2018 0952   CHOLHDL 4 12/11/2017 1001   VLDL 17.0 12/11/2017 1001   LDLCALC 128 (H) 07/05/2018 0952   Hepatic Function Panel     Component Value Date/Time   PROT 6.7 07/05/2018 0952   ALBUMIN 4.2 07/05/2018 0952   AST 23 07/05/2018 0952   ALT 26 07/05/2018 0952   ALKPHOS 75 07/05/2018 0952   BILITOT 0.8 07/05/2018 0952      Component Value Date/Time   TSH 2.040 02/12/2018 1237   TSH 2.64 04/27/2016 1547   Results for Natasha [name], Natasha [name] (MRN [id number]) as of 09/26/2018 12:52  Ref. Range 07/05/2018 09:52  Vitamin D, 25-Hydroxy Latest Ref Range: 30.0 - 100.0 ng/mL 25.4 (L)   ASSESSMENT AND PLAN: Vitamin D deficiency - Plan: Vitamin D, Ergocalciferol, (DRISDOL) 1.25 MG (50000 UT) CAPS capsule  Essential hypertension  At risk for osteoporosis  Class 2 severe obesity with serious comorbidity and body mass index (BMI) of 37.0 to 37.9 in adult, unspecified obesity type ([place])  PLAN:  Vitamin D Deficiency Natasha [name] was informed that low vitamin D levels contributes to fatigue and are associated with obesity, breast, and colon cancer. She agrees to continue to take prescription Vit D @50 ,000 IU every week #4 with no refills and will follow up for routine testing of vitamin D, at least 2-3 times per year. She was informed of the risk of over-replacement of vitamin D and agrees to not increase her dose unless she discusses this with [place] first. Natasha [name] agrees to follow up as directed.  At risk for  osteopenia and osteoporosis Natasha [name] was given extended  (15 minutes) osteoporosis prevention counseling today. Natasha [name] is at risk for osteopenia and osteoporosis due to her vitamin D deficiency. She was encouraged to take her vitamin D and follow her higher calcium diet and increase strengthening exercise to help strengthen her bones and decrease her risk of osteopenia and osteoporosis.  Hypertension We discussed sodium restriction, working on healthy weight loss, and a regular exercise program as the means to achieve improved blood pressure control. Natasha [name] agreed with this plan and agreed to follow up as directed. We  will continue to monitor her blood pressure as well as her progress with the above lifestyle modifications. She will continue her medications as prescribed and will watch for signs of hypotension as she continues her lifestyle modifications.  Obesity Natasha [name] is currently in the action stage of change. As such, her goal is to continue with weight loss efforts She has agreed to follow the Category 3 plan Natasha [name] has been instructed to work up to a goal of 150 minutes of combined cardio and strengthening exercise per week for weight loss and overall health benefits. We discussed the following Behavioral Modification Strategies today: increasing lean protein intake and no skipping meals  Natasha [name] has agreed to follow up with our clinic in 3 weeks. She was informed of the importance of frequent follow up visits to maximize her success with intensive lifestyle modifications for her multiple health conditions.   OBESITY BEHAVIORAL INTERVENTION VISIT  Today's visit was # 11   Starting weight: 241 lbs Starting date: 02/12/18 Today's weight : 210 lbs  Today's date: 09/26/2018 Total lbs lost to date: 48   ASK: We discussed the diagnosis of obesity with Natasha [name] today and Natasha [name] agreed to give [place] permission to discuss obesity behavioral modification therapy today.  ASSESS: Natasha [name] has the  diagnosis of obesity and her BMI today is 37.21 Natasha [name] is in the action stage of change   ADVISE: Natasha [name] was educated on the multiple health risks of obesity as well as the benefit of weight loss to improve her health. She was advised of the need for long term treatment and the importance of lifestyle modifications to improve her current health and to decrease her risk of future health problems.  AGREE: Multiple dietary modification options and treatment options were discussed and  Natasha [name] agreed to follow the recommendations documented in the above note.  ARRANGE: Tesla was educated on the importance of frequent visits to treat obesity as outlined per CMS and USPSTF guidelines and agreed to schedule her next follow up appointment today.  Corey Skains, am acting as transcriptionist for Abby Potash, PA-C I, Abby Potash, PA-C have reviewed above note and agree with its content

## 2018-10-16 ENCOUNTER — Ambulatory Visit (INDEPENDENT_AMBULATORY_CARE_PROVIDER_SITE_OTHER): Payer: BLUE CROSS/BLUE SHIELD | Admitting: Physician Assistant

## 2018-10-16 ENCOUNTER — Encounter (INDEPENDENT_AMBULATORY_CARE_PROVIDER_SITE_OTHER): Payer: Self-pay | Admitting: Physician Assistant

## 2018-10-16 VITALS — BP 146/78 | HR 58 | Temp 97.8°F | Ht 63.0 in | Wt 210.0 lb

## 2018-10-16 DIAGNOSIS — Z6837 Body mass index (BMI) 37.0-37.9, adult: Secondary | ICD-10-CM

## 2018-10-16 DIAGNOSIS — Z9189 Other specified personal risk factors, not elsewhere classified: Secondary | ICD-10-CM | POA: Diagnosis not present

## 2018-10-16 DIAGNOSIS — E559 Vitamin D deficiency, unspecified: Secondary | ICD-10-CM | POA: Diagnosis not present

## 2018-10-16 DIAGNOSIS — E7849 Other hyperlipidemia: Secondary | ICD-10-CM

## 2018-10-16 MED ORDER — VITAMIN D (ERGOCALCIFEROL) 1.25 MG (50000 UNIT) PO CAPS: 50000.0000 [IU] | ORAL_CAPSULE | ORAL | 0 refills | Status: DC

## 2018-10-22 NOTE — Progress Notes (Signed)
Office: [phone]  /  Fax: [phone]   HPI:   Chief Complaint: OBESITY Natasha [name] is here to discuss her progress with her obesity treatment plan. She is on the Category 3 plan and is following her eating plan approximately 75 % of the time. She states she is exercising 0 minutes 0 times per week. Natasha [name] reports that she has not been getting in all of her meals daily, due to her work schedule. She is sometimes skipping lunch or eating peanut butter and jelly. Her weight is 210 lb (95.3 kg) today and she has maintained weight over a period of 3 weeks since her last visit. She has lost 31 lbs since starting treatment with [place].  Vitamin D deficiency Natasha [name] has a diagnosis of vitamin D deficiency. She is currently taking vit D and denies nausea, vomiting or muscle weakness.  Hyperlipidemia Natasha [name] has hyperlipidemia and has been trying to improve her cholesterol levels with intensive lifestyle modification including a low saturated fat diet, exercise and weight loss. Her last LDL was at 128 and she is not on medications. She denies any chest pain.  At risk for cardiovascular disease Natasha [name] is at a higher than average risk for cardiovascular disease due to obesity and hyperlipidemia. She currently denies any chest pain.  ALLERGIES: Allergies  Allergen Reactions  . Codeine Nausea Only    MEDICATIONS: Current Outpatient Medications on File Prior to Visit  Medication Sig Dispense Refill  . aspirin 81 MG tablet Take 81 mg by mouth daily.    [name] lisinopril (PRINIVIL,ZESTRIL) 5 MG tablet Take 1 tablet (5 mg total) by mouth daily. 90 tablet 3   No current facility-administered medications on file prior to visit.     PAST MEDICAL HISTORY: Past Medical History:  Diagnosis Date  . Allergy   . Anemia   . Back pain   . Cancer ([place])   . HTN (hypertension)   . SOB (shortness of breath) on exertion   . Spinal stenosis   . Torn meniscus     PAST SURGICAL HISTORY: Past Surgical History:    Procedure Laterality Date  . CESAREAN SECTION    . KNEE ARTHROSCOPY  12/20/2017   left knee  . TONSILLECTOMY  1984    SOCIAL HISTORY: Social History   Tobacco Use  . Smoking status: Never Smoker  . Smokeless tobacco: Never Used  Substance Use Topics  . Alcohol use: No  . Drug use: No    FAMILY HISTORY: Family History  Problem Relation Age of Onset  . High blood pressure Mother   . High Cholesterol Mother   . Heart disease Mother   . Cancer Father   . High blood pressure Father     ROS: Review of Systems  Constitutional: Negative for weight loss.  Cardiovascular: Negative for chest pain.  Gastrointestinal: Negative for nausea and vomiting.  Musculoskeletal:       Negative for muscle weakness    PHYSICAL EXAM: Blood pressure (!) 146/78, pulse (!) 58, temperature 97.8 F (36.6 C), temperature source Oral, height 5\' 3"  (1.6 m), weight 210 lb (95.3 kg), SpO2 100 %. Body mass index is 37.2 kg/m. Physical Exam  Constitutional: She is oriented to person, place, and time. She appears well-developed and well-nourished.  Cardiovascular: Normal rate.  Pulmonary/Chest: Effort normal.  Musculoskeletal: Normal range of motion.  Neurological: She is oriented to person, place, and time.  Skin: Skin is warm and dry.  Psychiatric: She has a normal mood and affect. Her behavior is  normal.  Vitals reviewed.   RECENT LABS AND TESTS: BMET    Component Value Date/Time   NA 139 07/05/2018 0952   K 4.2 07/05/2018 0952   CL 102 07/05/2018 0952   CO2 23 07/05/2018 0952   GLUCOSE 97 07/05/2018 0952   GLUCOSE 111 (H) 12/11/2017 1001   BUN 17 07/05/2018 0952   CREATININE 0.71 07/05/2018 0952   CREATININE 0.70 10/02/2013 1736   CALCIUM 9.3 07/05/2018 0952   GFRNONAA 95 07/05/2018 0952   GFRAA 109 07/05/2018 0952   Lab Results  Component Value Date   HGBA1C 5.3 07/05/2018   HGBA1C 5.5 02/12/2018   HGBA1C 5.8 12/11/2017   HGBA1C 5.7 04/27/2016   Lab Results  Component  Value Date   INSULIN 11.9 07/05/2018   INSULIN 21.9 02/12/2018   CBC    Component Value Date/Time   WBC 5.1 12/11/2017 1001   RBC 5.02 12/11/2017 1001   HGB 14.0 12/11/2017 1001   HCT 42.3 12/11/2017 1001   PLT 172.0 12/11/2017 1001   MCV 84.2 12/11/2017 1001   MCV 88.4 10/02/2013 1736   MCH 27.1 10/02/2013 1736   MCHC 33.2 12/11/2017 1001   RDW 13.3 12/11/2017 1001   Iron/TIBC/Ferritin/ %Sat No results found for: IRON, TIBC, FERRITIN, IRONPCTSAT Lipid Panel     Component Value Date/Time   CHOL 193 07/05/2018 0952   TRIG 63 07/05/2018 0952   HDL 52 07/05/2018 0952   CHOLHDL 4 12/11/2017 1001   VLDL 17.0 12/11/2017 1001   LDLCALC 128 (H) 07/05/2018 0952   Hepatic Function Panel     Component Value Date/Time   PROT 6.7 07/05/2018 0952   ALBUMIN 4.2 07/05/2018 0952   AST 23 07/05/2018 0952   ALT 26 07/05/2018 0952   ALKPHOS 75 07/05/2018 0952   BILITOT 0.8 07/05/2018 0952      Component Value Date/Time   TSH 2.040 02/12/2018 1237   TSH 2.64 04/27/2016 1547   Results for HOLLYE, PRITT (MRN [id number]) as of 10/22/2018 14:55  Ref. Range 07/05/2018 09:52  Vitamin D, 25-Hydroxy Latest Ref Range: 30.0 - 100.0 ng/mL 25.4 (L)   ASSESSMENT AND PLAN: Vitamin D deficiency - Plan: Vitamin D, Ergocalciferol, (DRISDOL) 1.25 MG (50000 UT) CAPS capsule  Other hyperlipidemia  At risk for heart disease  Class 2 severe obesity with serious comorbidity and body mass index (BMI) of 37.0 to 37.9 in adult, unspecified obesity type ([place])  PLAN:  Vitamin D Deficiency Natasha [name] was informed that low vitamin D levels contributes to fatigue and are associated with obesity, breast, and colon cancer. She agrees to continue to take prescription Vit D @50 ,000 IU every week #4 with no refills and will follow up for routine testing of vitamin D, at least 2-3 times per year. She was informed of the risk of over-replacement of vitamin D and agrees to not increase her dose unless she discusses this  with [place] first. Natasha [name] agrees to follow up with our clinic in 3 weeks.  Hyperlipidemia Natasha [name] was informed of the American Heart Association Guidelines emphasizing intensive lifestyle modifications as the first line treatment for hyperlipidemia. We discussed many lifestyle modifications today in depth, and Natasha [name] will continue to work on decreasing saturated fats such as fatty red meat, butter and many fried foods. She will also increase vegetables and lean protein in her diet and continue to work on exercise and weight loss efforts.  Cardiovascular risk counseling Natasha [name] was given extended (15 minutes) coronary artery disease prevention counseling today. She is  57 y.o. female and has risk factors for heart disease including obesity and hyperlipidemia. We discussed intensive lifestyle modifications today with an emphasis on specific weight loss instructions and strategies. Pt was also informed of the importance of increasing exercise and decreasing saturated fats to help prevent heart disease.  Obesity Natasha [name] is currently in the action stage of change. As such, her goal is to continue with weight loss efforts She has agreed to follow the Category 3 plan Natasha [name] has been instructed to work up to a goal of 150 minutes of combined cardio and strengthening exercise per week for weight loss and overall health benefits. We discussed the following Behavioral Modification Strategies today: work on meal planning and easy cooking plans and holiday eating strategies   Natasha [name] has agreed to follow up with our clinic in 3 weeks. She was informed of the importance of frequent follow up visits to maximize her success with intensive lifestyle modifications for her multiple health conditions.   OBESITY BEHAVIORAL INTERVENTION VISIT  Today's visit was # 12   Starting weight: 241 lbs Starting date: 02/12/2018 Today's weight :  210 lbs Today's date: 10/16/2018 Total lbs lost to date: 57   ASK: We discussed the  diagnosis of obesity with Natasha [name] today and Natasha [name] agreed to give [place] permission to discuss obesity behavioral modification therapy today.  ASSESS: Natasha [name] has the diagnosis of obesity and her BMI today is 37.21 Natasha [name] is in the action stage of change   ADVISE: Natasha [name] was educated on the multiple health risks of obesity as well as the benefit of weight loss to improve her health. She was advised of the need for long term treatment and the importance of lifestyle modifications to improve her current health and to decrease her risk of future health problems.  AGREE: Multiple dietary modification options and treatment options were discussed and  Natasha [name] agreed to follow the recommendations documented in the above note.  ARRANGE: Natasha [name] was educated on the importance of frequent visits to treat obesity as outlined per CMS and USPSTF guidelines and agreed to schedule her next follow up appointment today.  Corey Skains, am acting as transcriptionist for [employer], PA-C Gives me good energy through my workout and for the rest of the day without a crash.

## 2018-11-05 ENCOUNTER — Encounter (INDEPENDENT_AMBULATORY_CARE_PROVIDER_SITE_OTHER): Payer: Self-pay

## 2018-11-05 ENCOUNTER — Ambulatory Visit (INDEPENDENT_AMBULATORY_CARE_PROVIDER_SITE_OTHER): Payer: BLUE CROSS/BLUE SHIELD | Admitting: Physician Assistant

## 2018-11-12 ENCOUNTER — Ambulatory Visit (INDEPENDENT_AMBULATORY_CARE_PROVIDER_SITE_OTHER): Payer: BLUE CROSS/BLUE SHIELD | Admitting: Family Medicine

## 2018-11-12 ENCOUNTER — Encounter (INDEPENDENT_AMBULATORY_CARE_PROVIDER_SITE_OTHER): Payer: Self-pay | Admitting: Family Medicine

## 2018-11-12 VITALS — BP 149/79 | HR 58 | Temp 97.9°F | Ht 63.0 in | Wt 210.0 lb

## 2018-11-12 DIAGNOSIS — G4709 Other insomnia: Secondary | ICD-10-CM | POA: Diagnosis not present

## 2018-11-12 DIAGNOSIS — Z6837 Body mass index (BMI) 37.0-37.9, adult: Secondary | ICD-10-CM | POA: Diagnosis not present

## 2018-11-12 DIAGNOSIS — I10 Essential (primary) hypertension: Secondary | ICD-10-CM | POA: Diagnosis not present

## 2018-11-13 NOTE — Progress Notes (Signed)
Office: [phone]  /  Fax: [phone]   HPI:   Chief Complaint: OBESITY Jiana is here to discuss her progress with her obesity treatment plan. She is on the  follow the Category 3 plan and is following her eating plan approximately 90 % of the time. She states she is exercising 0 minutes 0 times per week. Albertine maintained her weight but stressed with not loosing further. She is very stressed at work and not sleeping well.  Her weight is 210 lb (95.3 kg) today and has maintained since her last visit. She has lost 31 lbs since starting treatment with [place].  Hypertension JALESA THIEN is a 57 y.o. female with hypertension.  Esmeralda Arthur denies chest pain or shortness of breath on exertion. She is working weight loss to help control her blood pressure with the goal of decreasing her risk of heart attack and stroke. Lornas blood pressure is not currently controlled. Her blood pressure today is elevated but normally better controlled but does note increase in stress.   Insomnia Patient is waking frequently. She feels it is related to job stress. She does note some daytime somnolence and increase in irritability.    ASSESSMENT AND PLAN:  Essential hypertension  Other insomnia  Class 2 severe obesity with serious comorbidity and body mass index (BMI) of 37.0 to 37.9 in adult, unspecified obesity type ([place])  PLAN:  Hypertension We discussed sodium restriction, working on healthy weight loss, and a regular exercise program as the means to achieve improved blood pressure control. Emanuela agreed with this plan and agreed to follow up as directed. We will continue to monitor her blood pressure as well as her progress with the above lifestyle modifications. She will continue her medications as prescribed and will watch for signs of hypotension as she continues her lifestyle modifications. Will recheck her blood pressure in month, may need to increase her medication dosage.   Insomnia Discussed  sleep hygiene techniques to help with sleep goals. Will follow up.   I spent > than 50% of the 25 minute visit on counseling as documented in the note.   Obesity Novice is currently in the action stage of change. As such, her goal is to continue with weight loss efforts She has agreed to follow the Category 3 plan Latisha has been instructed to work up to a goal of 150 minutes of combined cardio and strengthening exercise per week for weight loss and overall health benefits. We discussed the following Behavioral Modification Stratagies today: increasing lean protein intake, decreasing simple carbohydrates  and holiday eating strategies , and celebration eating strategies. Will work on maintaining weight over the holidays then retest metabolism at her next visit.    Sakura has agreed to follow up with our clinic in 4 weeks. She was informed of the importance of frequent follow up visits to maximize her success with intensive lifestyle modifications for her multiple health conditions.  ALLERGIES: Allergies  Allergen Reactions  . Codeine Nausea Only    MEDICATIONS: Current Outpatient Medications on File Prior to Visit  Medication Sig Dispense Refill  . aspirin 81 MG tablet Take 81 mg by mouth daily.    [name] lisinopril (PRINIVIL,ZESTRIL) 5 MG tablet Take 1 tablet (5 mg total) by mouth daily. 90 tablet 3  . Vitamin D, Ergocalciferol, (DRISDOL) 1.25 MG (50000 UT) CAPS capsule Take 1 capsule (50,000 Units total) by mouth every 7 (seven) days. 4 capsule 0   No current facility-administered medications on file prior  to visit.     PAST MEDICAL HISTORY: Past Medical History:  Diagnosis Date  . Allergy   . Anemia   . Back pain   . Cancer ([place])   . HTN (hypertension)   . SOB (shortness of breath) on exertion   . Spinal stenosis   . Torn meniscus     PAST SURGICAL HISTORY: Past Surgical History:  Procedure Laterality Date  . CESAREAN SECTION    . KNEE ARTHROSCOPY  12/20/2017   left knee    . TONSILLECTOMY  1984    SOCIAL HISTORY: Social History   Tobacco Use  . Smoking status: Never Smoker  . Smokeless tobacco: Never Used  Substance Use Topics  . Alcohol use: No  . Drug use: No    FAMILY HISTORY: Family History  Problem Relation Age of Onset  . High blood pressure Mother   . High Cholesterol Mother   . Heart disease Mother   . Cancer Father   . High blood pressure Father     ROS: Review of Systems  All other systems reviewed and are negative.   PHYSICAL EXAM: Blood pressure (!) 149/79, pulse (!) 58, temperature 97.9 F (36.6 C), temperature source Oral, height 5\' 3"  (1.6 m), weight 210 lb (95.3 kg), SpO2 96 %. Body mass index is 37.2 kg/m. Physical Exam Vitals signs reviewed.  HENT:     Head: Normocephalic.  Eyes:     Pupils: Pupils are equal, round, and reactive to light.  Cardiovascular:     Rate and Rhythm: Normal rate.  Pulmonary:     Effort: Pulmonary effort is normal.  Musculoskeletal: Normal range of motion.  Skin:    General: Skin is warm and dry.  Neurological:     Mental Status: She is alert and oriented to person, place, and time.  Psychiatric:        Behavior: Behavior normal.     RECENT LABS AND TESTS: BMET    Component Value Date/Time   NA 139 07/05/2018 0952   K 4.2 07/05/2018 0952   CL 102 07/05/2018 0952   CO2 23 07/05/2018 0952   GLUCOSE 97 07/05/2018 0952   GLUCOSE 111 (H) 12/11/2017 1001   BUN 17 07/05/2018 0952   CREATININE 0.71 07/05/2018 0952   CREATININE 0.70 10/02/2013 1736   CALCIUM 9.3 07/05/2018 0952   GFRNONAA 95 07/05/2018 0952   GFRAA 109 07/05/2018 0952   Lab Results  Component Value Date   HGBA1C 5.3 07/05/2018   HGBA1C 5.5 02/12/2018   HGBA1C 5.8 12/11/2017   HGBA1C 5.7 04/27/2016   Lab Results  Component Value Date   INSULIN 11.9 07/05/2018   INSULIN 21.9 02/12/2018   CBC    Component Value Date/Time   WBC 5.1 12/11/2017 1001   RBC 5.02 12/11/2017 1001   HGB 14.0 12/11/2017 1001    HCT 42.3 12/11/2017 1001   PLT 172.0 12/11/2017 1001   MCV 84.2 12/11/2017 1001   MCV 88.4 10/02/2013 1736   MCH 27.1 10/02/2013 1736   MCHC 33.2 12/11/2017 1001   RDW 13.3 12/11/2017 1001   Iron/TIBC/Ferritin/ %Sat No results found for: IRON, TIBC, FERRITIN, IRONPCTSAT Lipid Panel     Component Value Date/Time   CHOL 193 07/05/2018 0952   TRIG 63 07/05/2018 0952   HDL 52 07/05/2018 0952   CHOLHDL 4 12/11/2017 1001   VLDL 17.0 12/11/2017 1001   LDLCALC 128 (H) 07/05/2018 0952   Hepatic Function Panel     Component Value Date/Time  PROT 6.7 07/05/2018 0952   ALBUMIN 4.2 07/05/2018 0952   AST 23 07/05/2018 0952   ALT 26 07/05/2018 0952   ALKPHOS 75 07/05/2018 0952   BILITOT 0.8 07/05/2018 0952      Component Value Date/Time   TSH 2.040 02/12/2018 1237   TSH 2.64 04/27/2016 1547      OBESITY BEHAVIORAL INTERVENTION VISIT  Today's visit was # 13   Starting weight: 241 lbs Starting date: 02/12/18 Today's weight : Weight: 210 lb (95.3 kg)  Today's date: 11/12/18 Total lbs lost to date: 53  ASK: We discussed the diagnosis of obesity with Esmeralda Arthur today and Kern Reap agreed to give [place] permission to discuss obesity behavioral modification therapy today.  ASSESS: Elfie has the diagnosis of obesity and her BMI today is @TBMI @ Artisha is in the action stage of change   ADVISE: Maryellen was educated on the multiple health risks of obesity as well as the benefit of weight loss to improve her health. She was advised of the need for long term treatment and the importance of lifestyle modifications to improve her current health and to decrease her risk of future health problems.  AGREE: Multiple dietary modification options and treatment options were discussed and  Polina agreed to follow the recommendations documented in the above note.  ARRANGE: Kassy was educated on the importance of frequent visits to treat obesity as outlined per CMS and USPSTF guidelines and agreed to  schedule her next follow up appointment today.  I, April Moore, CMA, am acting as Location manager for Dennard Nip, MD

## 2018-12-13 ENCOUNTER — Encounter (INDEPENDENT_AMBULATORY_CARE_PROVIDER_SITE_OTHER): Payer: Self-pay | Admitting: Family Medicine

## 2018-12-13 ENCOUNTER — Ambulatory Visit (INDEPENDENT_AMBULATORY_CARE_PROVIDER_SITE_OTHER): Payer: BLUE CROSS/BLUE SHIELD | Admitting: Family Medicine

## 2018-12-13 VITALS — BP 136/78 | HR 65 | Temp 97.8°F | Ht 63.0 in | Wt 209.0 lb

## 2018-12-13 DIAGNOSIS — R7303 Prediabetes: Secondary | ICD-10-CM

## 2018-12-13 DIAGNOSIS — Z9189 Other specified personal risk factors, not elsewhere classified: Secondary | ICD-10-CM | POA: Diagnosis not present

## 2018-12-13 DIAGNOSIS — E559 Vitamin D deficiency, unspecified: Secondary | ICD-10-CM

## 2018-12-13 DIAGNOSIS — E7849 Other hyperlipidemia: Secondary | ICD-10-CM | POA: Diagnosis not present

## 2018-12-13 DIAGNOSIS — Z6837 Body mass index (BMI) 37.0-37.9, adult: Secondary | ICD-10-CM

## 2018-12-13 NOTE — Progress Notes (Signed)
Office: [phone]  /  Fax: [phone]   HPI:   Chief Complaint: OBESITY Natasha [name] is here to discuss her progress with her obesity treatment plan. She is on the Category 3 plan and is following her eating plan approximately 75 % of the time. She states she is exercising 0 minutes 0 times per week. Natasha [name] has done well avoiding weight gain over the holidays and has even lost 1 pound. She is frustrated that she didn't lose more even though she know she deviated from her plan. She is also getting no support from her family. In fact, she is getting active sabotage, especially at dinner.  Her weight is 209 lb (94.8 kg) today and has had a weight loss of 1 pound over a period of 4 weeks since her last visit. She has lost 32 lbs since starting treatment with [place].  Pre-Diabetes Natasha [name] has a diagnosis of pre-diabetes based on her elevated Hgb A1c and was informed this puts her at greater risk of developing diabetes. She is stable on her diet and is due for labs today. She is not taking metformin currently and continues to work on diet and exercise to decrease risk of diabetes. She denies hypoglycemia.  At risk for diabetes Natasha [name] is at higher than average risk for developing diabetes due to her pre-diabetes and obesity. She currently denies polyuria or polydipsia.  Hyperlipidemia Natasha [name] has hyperlipidemia and is attempting to diet control to improve her cholesterol levels with intensive lifestyle modification including a low saturated fat diet, exercise and weight loss. She is due for labs today and she denies any chest pain.  Vitamin D deficiency Natasha [name] has a diagnosis of vitamin D deficiency. She is currently stable on vit D. She admits that fatigue is improving and denies nausea, vomiting, or muscle weakness.  ASSESSMENT AND PLAN:  Prediabetes - Plan: Comprehensive metabolic panel, Hemoglobin A1c, Insulin, random  Other hyperlipidemia - Plan: Lipid Panel With LDL/HDL Ratio  Vitamin D deficiency  - Plan: VITAMIN D 25 Hydroxy (Vit-D Deficiency, Fractures)  At risk for diabetes mellitus  Class 2 severe obesity with serious comorbidity and body mass index (BMI) of 37.0 to 37.9 in adult, unspecified obesity type [facility])  PLAN:  Pre-Diabetes Natasha [name] will continue to work on weight loss, exercise, and decreasing simple carbohydrates in her diet to help decrease the risk of diabetes. She was informed that eating too many simple carbohydrates or too many calories at one sitting increases the likelihood of GI side effects. Labs were ordered today. Natasha [name] agreed to continue with her diet prescription and to follow up with [place] as directed to monitor her progress in 3 weeks.  Diabetes risk counseling Natasha [name] was given extended (15 minutes) diabetes prevention counseling today. She is 58 y.o. female and has risk factors for diabetes including pre-diabetes and obesity. We discussed intensive lifestyle modifications today with an emphasis on weight loss as well as increasing exercise and decreasing simple carbohydrates in her diet.  Hyperlipidemia Natasha [name] was informed of the American Heart Association Guidelines emphasizing intensive lifestyle modifications as the first line treatment for hyperlipidemia. We discussed many lifestyle modifications today in depth, and Natasha [name] will continue to work on decreasing saturated fats such as fatty red meat, butter and many fried foods. She will also increase vegetables and lean protein in her diet and continue to work on exercise and weight loss efforts. We will order labs today and Natasha [name] agrees to continue with her diet prescription. She will follow up as directed.  Vitamin  D Deficiency Natasha [name] was informed that low vitamin D levels contributes to fatigue and are associated with obesity, breast, and colon cancer. She agrees to continue to take prescription Vit D @50 ,000 IU every week and will follow up for routine testing of vitamin D, at least 2-3 times per year. She was  informed of the risk of over-replacement of vitamin D and agrees to not increase her dose unless she discusses this with [place] first. We will check her vitamin D level today and she agrees to follow up as directed.  Obesity Natasha [name] is currently in the action stage of change. As such, her goal is to continue with weight loss efforts. She has agreed to follow the Category 3 plan and keep a food journal with 400 to 550 calories and 40+ grams of protein for supper. Natasha [name] has been instructed to work up to a goal of 150 minutes of combined cardio and strengthening exercise per week for weight loss and overall health benefits. We discussed the following Behavioral Modification Strategies today: increasing lean protein intake, work on meal planning and easy cooking plans, and dealing with family or coworker sabotage.  Natasha [name] has agreed to follow up with our clinic in 3 weeks. She was informed of the importance of frequent follow up visits to maximize her success with intensive lifestyle modifications for her multiple health conditions.  ALLERGIES: Allergies  Allergen Reactions  . Codeine Nausea Only    MEDICATIONS: Current Outpatient Medications on File Prior to Visit  Medication Sig Dispense Refill  . aspirin 81 MG tablet Take 81 mg by mouth daily.    [name] lisinopril (PRINIVIL,ZESTRIL) 5 MG tablet Take 1 tablet (5 mg total) by mouth daily. 90 tablet 3  . Vitamin D, Ergocalciferol, (DRISDOL) 1.25 MG (50000 UT) CAPS capsule Take 1 capsule (50,000 Units total) by mouth every 7 (seven) days. 4 capsule 0   No current facility-administered medications on file prior to visit.     PAST MEDICAL HISTORY: Past Medical History:  Diagnosis Date  . Allergy   . Anemia   . Back pain   . Cancer ([place])   . HTN (hypertension)   . SOB (shortness of breath) on exertion   . Spinal stenosis   . Torn meniscus     PAST SURGICAL HISTORY: Past Surgical History:  Procedure Laterality Date  . CESAREAN SECTION    .  KNEE ARTHROSCOPY  12/20/2017   left knee  . TONSILLECTOMY  1984    SOCIAL HISTORY: Social History   Tobacco Use  . Smoking status: Never Smoker  . Smokeless tobacco: Never Used  Substance Use Topics  . Alcohol use: No  . Drug use: No    FAMILY HISTORY: Family History  Problem Relation Age of Onset  . High blood pressure Mother   . High Cholesterol Mother   . Heart disease Mother   . Cancer Father   . High blood pressure Father    ROS: Review of Systems  Constitutional: Positive for malaise/fatigue and weight loss.  Cardiovascular: Negative for chest pain.  Gastrointestinal: Negative for nausea and vomiting.  Genitourinary:       Negative for polyuria.  Musculoskeletal:       Negative for muscle weakness.  Endo/Heme/Allergies: Negative for polydipsia.       Negative for hypoglycemia.   PHYSICAL EXAM: Blood pressure 136/78, pulse 65, temperature 97.8 F (36.6 C), temperature source Oral, height 5\' 3"  (1.6 m), weight 209 lb (94.8 kg), SpO2 98 %. Body mass index  is 37.02 kg/m. Physical Exam Vitals signs reviewed.  Constitutional:      Appearance: Normal appearance. She is obese.  Cardiovascular:     Rate and Rhythm: Normal rate.  Pulmonary:     Effort: Pulmonary effort is normal.  Musculoskeletal: Normal range of motion.  Skin:    General: Skin is warm and dry.  Neurological:     Mental Status: She is alert and oriented to person, place, and time.  Psychiatric:        Mood and Affect: Mood normal.        Behavior: Behavior normal.    RECENT LABS AND TESTS: BMET    Component Value Date/Time   NA 139 07/05/2018 0952   K 4.2 07/05/2018 0952   CL 102 07/05/2018 0952   CO2 23 07/05/2018 0952   GLUCOSE 97 07/05/2018 0952   GLUCOSE 111 (H) 12/11/2017 1001   BUN 17 07/05/2018 0952   CREATININE 0.71 07/05/2018 0952   CREATININE 0.70 10/02/2013 1736   CALCIUM 9.3 07/05/2018 0952   GFRNONAA 95 07/05/2018 0952   GFRAA 109 07/05/2018 0952   Lab Results    Component Value Date   HGBA1C 5.3 07/05/2018   HGBA1C 5.5 02/12/2018   HGBA1C 5.8 12/11/2017   HGBA1C 5.7 04/27/2016   Lab Results  Component Value Date   INSULIN 11.9 07/05/2018   INSULIN 21.9 02/12/2018   CBC    Component Value Date/Time   WBC 5.1 12/11/2017 1001   RBC 5.02 12/11/2017 1001   HGB 14.0 12/11/2017 1001   HCT 42.3 12/11/2017 1001   PLT 172.0 12/11/2017 1001   MCV 84.2 12/11/2017 1001   MCV 88.4 10/02/2013 1736   MCH 27.1 10/02/2013 1736   MCHC 33.2 12/11/2017 1001   RDW 13.3 12/11/2017 1001   Iron/TIBC/Ferritin/ %Sat No results found for: IRON, TIBC, FERRITIN, IRONPCTSAT Lipid Panel     Component Value Date/Time   CHOL 193 07/05/2018 0952   TRIG 63 07/05/2018 0952   HDL 52 07/05/2018 0952   CHOLHDL 4 12/11/2017 1001   VLDL 17.0 12/11/2017 1001   LDLCALC 128 (H) 07/05/2018 0952   Hepatic Function Panel     Component Value Date/Time   PROT 6.7 07/05/2018 0952   ALBUMIN 4.2 07/05/2018 0952   AST 23 07/05/2018 0952   ALT 26 07/05/2018 0952   ALKPHOS 75 07/05/2018 0952   BILITOT 0.8 07/05/2018 0952      Component Value Date/Time   TSH 2.040 02/12/2018 1237   TSH 2.64 04/27/2016 1547   Results for ALAIJA, RUBLE (MRN [id number]) as of 12/13/2018 10:56  Ref. Range 07/05/2018 09:52  Vitamin D, 25-Hydroxy Latest Ref Range: 30.0 - 100.0 ng/mL 25.4 (L)    OBESITY BEHAVIORAL INTERVENTION VISIT  Today's visit was # 14   Starting weight: 241 lbs Starting date: 02/12/18 Today's weight : Weight: 209 lb (94.8 kg)  Today's date: 12/13/2018 Total lbs lost to date: 31  ASK: We discussed the diagnosis of obesity with Esmeralda Arthur today and Kern Reap agreed to give [place] permission to discuss obesity behavioral modification therapy today.  ASSESS: Keyleigh has the diagnosis of obesity and her BMI today is 37.0. Aishah is in the action stage of change.   ADVISE: Trevia was educated on the multiple health risks of obesity as well as the benefit of weight loss to  improve her health. She was advised of the need for long term treatment and the importance of lifestyle modifications to improve her current health and  to decrease her risk of future health problems.  AGREE: Multiple dietary modification options and treatment options were discussed and Adana agreed to follow the recommendations documented in the above note.  ARRANGE: Kinsley was educated on the importance of frequent visits to treat obesity as outlined per CMS and USPSTF guidelines and agreed to schedule her next follow up appointment today.  I, Marcille Blanco, am acting as transcriptionist for Starlyn Skeans, MD  I have reviewed the above documentation for accuracy and completeness, and I agree with the above. -Dennard Nip, MD

## 2018-12-14 LAB — COMPREHENSIVE METABOLIC PANEL
ALBUMIN: 4.6 g/dL (ref 3.8–4.9)
ALT: 47 IU/L — ABNORMAL HIGH (ref 0–32)
AST: 29 IU/L (ref 0–40)
Albumin/Globulin Ratio: 1.9 (ref 1.2–2.2)
Alkaline Phosphatase: 72 IU/L (ref 39–117)
BUN / CREAT RATIO: 29 — AB (ref 9–23)
BUN: 18 mg/dL (ref 6–24)
Bilirubin Total: 0.7 mg/dL (ref 0.0–1.2)
CALCIUM: 9.6 mg/dL (ref 8.7–10.2)
CO2: 23 mmol/L (ref 20–29)
CREATININE: 0.63 mg/dL (ref 0.57–1.00)
Chloride: 100 mmol/L (ref 96–106)
GFR, EST AFRICAN AMERICAN: 115 mL/min/{1.73_m2} (ref >59)
GFR, EST NON AFRICAN AMERICAN: 100 mL/min/{1.73_m2} (ref >59)
GLOBULIN, TOTAL: 2.4 g/dL (ref 1.5–4.5)
Glucose: 85 mg/dL (ref 65–99)
Potassium: 4.3 mmol/L (ref 3.5–5.2)
Sodium: 139 mmol/L (ref 134–144)
TOTAL PROTEIN: 7 g/dL (ref 6.0–8.5)

## 2018-12-14 LAB — LIPID PANEL WITH LDL/HDL RATIO
CHOLESTEROL TOTAL: 193 mg/dL (ref 100–199)
HDL: 50 mg/dL (ref >39)
LDL Calculated: 131 mg/dL — ABNORMAL HIGH (ref 0–99)
LDl/HDL Ratio: 2.6 ratio (ref 0.0–3.2)
Triglycerides: 61 mg/dL (ref 0–149)
VLDL Cholesterol Cal: 12 mg/dL (ref 5–40)

## 2018-12-14 LAB — HEMOGLOBIN A1C
Est. average glucose Bld gHb Est-mCnc: 100 mg/dL
Hgb A1c MFr Bld: 5.1 % (ref 4.8–5.6)

## 2018-12-14 LAB — VITAMIN D 25 HYDROXY (VIT D DEFICIENCY, FRACTURES): Vit D, 25-Hydroxy: 18.5 ng/mL — ABNORMAL LOW (ref 30.0–100.0)

## 2018-12-14 LAB — INSULIN, RANDOM: INSULIN: 10.2 u[IU]/mL (ref 2.6–24.9)

## 2019-01-03 ENCOUNTER — Ambulatory Visit (INDEPENDENT_AMBULATORY_CARE_PROVIDER_SITE_OTHER): Payer: BLUE CROSS/BLUE SHIELD | Admitting: Family Medicine

## 2019-01-17 ENCOUNTER — Ambulatory Visit (INDEPENDENT_AMBULATORY_CARE_PROVIDER_SITE_OTHER): Payer: BLUE CROSS/BLUE SHIELD | Admitting: Family Medicine

## 2019-02-05 ENCOUNTER — Ambulatory Visit (INDEPENDENT_AMBULATORY_CARE_PROVIDER_SITE_OTHER): Payer: BLUE CROSS/BLUE SHIELD | Admitting: Family Medicine

## 2019-02-27 ENCOUNTER — Ambulatory Visit (INDEPENDENT_AMBULATORY_CARE_PROVIDER_SITE_OTHER): Payer: BLUE CROSS/BLUE SHIELD | Admitting: Family Medicine

## 2019-11-18 ENCOUNTER — Ambulatory Visit: Payer: BLUE CROSS/BLUE SHIELD | Admitting: Podiatry

## 2019-12-10 ENCOUNTER — Encounter: Payer: Self-pay | Admitting: Sports Medicine

## 2019-12-10 ENCOUNTER — Ambulatory Visit: Payer: BLUE CROSS/BLUE SHIELD | Admitting: Sports Medicine

## 2019-12-10 ENCOUNTER — Other Ambulatory Visit: Payer: Self-pay

## 2019-12-10 VITALS — Temp 97.3°F

## 2019-12-10 DIAGNOSIS — M79672 Pain in left foot: Secondary | ICD-10-CM

## 2019-12-10 DIAGNOSIS — B359 Dermatophytosis, unspecified: Secondary | ICD-10-CM

## 2019-12-10 DIAGNOSIS — B351 Tinea unguium: Secondary | ICD-10-CM | POA: Diagnosis not present

## 2019-12-10 LAB — HEPATIC FUNCTION PANEL
AG Ratio: 1.8 (calc) (ref 1.0–2.5)
ALT: 29 U/L (ref 6–29)
AST: 27 U/L (ref 10–35)
Albumin: 4.2 g/dL (ref 3.6–5.1)
Alkaline phosphatase (APISO): 55 U/L (ref 37–153)
Bilirubin, Direct: 0.1 mg/dL (ref 0.0–0.2)
Globulin: 2.4 g/dL (calc) (ref 1.9–3.7)
Indirect Bilirubin: 0.5 mg/dL (calc) (ref 0.2–1.2)
Total Bilirubin: 0.6 mg/dL (ref 0.2–1.2)
Total Protein: 6.6 g/dL (ref 6.1–8.1)

## 2019-12-10 MED ORDER — CLOTRIMAZOLE 1 % EX SOLN: 1.0000 "application " | Freq: Two times a day (BID) | CUTANEOUS | 5 refills | Status: DC

## 2019-12-10 MED ORDER — NYSTATIN-TRIAMCINOLONE 100000-0.1 UNIT/GM-% EX OINT: 1.0000 "application " | TOPICAL_OINTMENT | Freq: Two times a day (BID) | CUTANEOUS | 0 refills | Status: DC

## 2019-12-10 NOTE — Progress Notes (Signed)
Subjective: Natasha [name] is a 59 y.o. female patient seen today in office for f/u on tinea on left foot states that her foot has flared back up again and she thought she can control at home has tried every over-the-counter medication with no results had a laser of her toenail was treated back in 2018 and thought that things were helping but slowly over time has gotten worse again.  Patient denies changes with health or medications.  Patient has no other pedal complaints at this time.   Patient Active Problem List   Diagnosis Date Noted  . Obesity 04/27/2016  . Pain in the chest 04/27/2016    Current Outpatient Medications on File Prior to Visit  Medication Sig Dispense Refill  . aspirin 81 MG tablet Take 81 mg by mouth daily.    [name] lisinopril (PRINIVIL,ZESTRIL) 5 MG tablet Take 1 tablet (5 mg total) by mouth daily. (Patient not taking: Reported on 12/10/2019) 90 tablet 3  . Vitamin D, Ergocalciferol, (DRISDOL) 1.25 MG (50000 UT) CAPS capsule Take 1 capsule (50,000 Units total) by mouth every 7 (seven) days. (Patient not taking: Reported on 12/10/2019) 4 capsule 0   No current facility-administered medications on file prior to visit.    Allergies  Allergen Reactions  . Codeine Nausea Only    Objective: Physical Exam  General: Well developed, nourished, no acute distress, awake, alert and oriented x 3  Vascular: Dorsalis pedis artery 2/4 bilateral, Posterior tibial artery 2/4 bilateral, skin temperature warm to warm proximal to distal bilateral lower extremities, no varicosities, pedal hair present bilateral.  Neurological: Gross sensation present via light touch bilateral.   Dermatological: Skin is warm, dry, and supple bilateral, Nails 1-10 are tender, short thick, and discolored with mild subungal debris L>R , no webspace macerations present bilateral, no open lesions present bilateral, no callus/corns/hyperkeratotic tissue present bilateral. + scaly skin in annular fashion left foot  consistent with tinea. No other signs of infection bilateral.  Musculoskeletal: No boney deformities noted bilateral. Muscular strength within normal limits without painon range of motion. No pain with calf compression bilateral.  Assessment and Plan:  Problem List Items Addressed This Visit    None    Visit Diagnoses    Tinea    -  Primary   Relevant Medications   clotrimazole (LOTRIMIN) 1 % external solution   nystatin-triamcinolone ointment (MYCOLOG)   Nail fungus       Relevant Medications   clotrimazole (LOTRIMIN) 1 % external solution   nystatin-triamcinolone ointment (MYCOLOG)   Other Relevant Orders   Hepatic Function Panel   Left foot pain         -Examined patient -Prescribed nystatin ointment to use to rash on the bottom of the left foot/tinea and prescribed clotrimazole solution to use in between her toes -Ordered liver function test and advised patient if normal will start her on oral Lamisil -Advised good hygiene habits -Patient to return in 6 weeks for medication check or sooner if problems or condition worsens.  Landis Martins, DPM

## 2019-12-11 ENCOUNTER — Other Ambulatory Visit: Payer: Self-pay | Admitting: Sports Medicine

## 2019-12-11 MED ORDER — TERBINAFINE HCL 250 MG PO TABS: 250.0000 mg | ORAL_TABLET | Freq: Every day | ORAL | 0 refills | Status: DC

## 2019-12-11 NOTE — Progress Notes (Signed)
LFTs normal lamisil sent to pharmacy

## 2020-01-08 ENCOUNTER — Other Ambulatory Visit: Payer: Self-pay | Admitting: Sports Medicine

## 2020-01-08 ENCOUNTER — Telehealth: Payer: Self-pay | Admitting: Sports Medicine

## 2020-01-08 DIAGNOSIS — B359 Dermatophytosis, unspecified: Secondary | ICD-10-CM

## 2020-01-08 MED ORDER — NYSTATIN-TRIAMCINOLONE 100000-0.1 UNIT/GM-% EX OINT: 1.0000 "application " | TOPICAL_OINTMENT | Freq: Two times a day (BID) | CUTANEOUS | 0 refills | Status: DC

## 2020-01-08 NOTE — Telephone Encounter (Signed)
Sent!

## 2020-01-08 NOTE — Progress Notes (Signed)
Refille mycolog cream

## 2020-01-08 NOTE — Telephone Encounter (Signed)
Dr. Stover please advice 

## 2020-01-08 NOTE — Telephone Encounter (Signed)
Pt called requesting refill of Mycolog ointment.  Pharmacy is [facility]

## 2020-01-21 ENCOUNTER — Ambulatory Visit: Payer: BC Managed Care – PPO | Admitting: Sports Medicine

## 2020-03-03 ENCOUNTER — Ambulatory Visit: Payer: BC Managed Care – PPO | Admitting: Sports Medicine

## 2020-03-24 ENCOUNTER — Ambulatory Visit (INDEPENDENT_AMBULATORY_CARE_PROVIDER_SITE_OTHER): Payer: BC Managed Care – PPO | Admitting: Sports Medicine

## 2020-03-24 ENCOUNTER — Encounter: Payer: Self-pay | Admitting: Sports Medicine

## 2020-03-24 ENCOUNTER — Other Ambulatory Visit: Payer: Self-pay

## 2020-03-24 VITALS — Temp 97.4°F

## 2020-03-24 DIAGNOSIS — M79672 Pain in left foot: Secondary | ICD-10-CM

## 2020-03-24 DIAGNOSIS — B359 Dermatophytosis, unspecified: Secondary | ICD-10-CM | POA: Diagnosis not present

## 2020-03-24 DIAGNOSIS — B351 Tinea unguium: Secondary | ICD-10-CM

## 2020-03-24 MED ORDER — NYSTATIN-TRIAMCINOLONE 100000-0.1 UNIT/GM-% EX OINT: 1.0000 "application " | TOPICAL_OINTMENT | Freq: Two times a day (BID) | CUTANEOUS | 0 refills | Status: DC

## 2020-03-24 MED ORDER — CLOTRIMAZOLE 1 % EX SOLN: 1.0000 "application " | Freq: Two times a day (BID) | CUTANEOUS | 5 refills | Status: DC

## 2020-03-24 NOTE — Progress Notes (Signed)
Subjective: Natasha [name] is a 59 y.o. female patient seen today in office for f/u on tinea on left foot and nail fungus. Reports that medication has helped and slowly seeing a difference on nails  Patient has no other pedal complaints at this time.   Patient Active Problem List   Diagnosis Date Noted  . Obesity 04/27/2016  . Pain in the chest 04/27/2016    Current Outpatient Medications on File Prior to Visit  Medication Sig Dispense Refill  . ibuprofen (ADVIL) 800 MG tablet Take 800 mg by mouth every 8 (eight) hours as needed.    . methocarbamol (ROBAXIN) 500 MG tablet     . terbinafine (LAMISIL) 250 MG tablet Take 1 tablet (250 mg total) by mouth daily. (Patient not taking: Reported on 03/24/2020) 90 tablet 0   No current facility-administered medications on file prior to visit.    Allergies  Allergen Reactions  . Codeine Nausea Only    Objective: Physical Exam  General: Well developed, nourished, no acute distress, awake, alert and oriented x 3  Vascular: Dorsalis pedis artery 2/4 bilateral, Posterior tibial artery 2/4 bilateral, skin temperature warm to warm proximal to distal bilateral lower extremities, no varicosities, pedal hair present bilateral.  Neurological: Gross sensation present via light touch bilateral.   Dermatological: Skin is warm, dry, and supple bilateral, Nails 1-10 are tender, short thick, and discolored with mild subungal debris L>R , no webspace macerations present bilateral, no open lesions present bilateral, no callus/corns/hyperkeratotic tissue present bilateral. + decreased scaly skin in annular fashion left foot consistent with tinea. No other signs of infection bilateral.  Musculoskeletal: No boney deformities noted bilateral. Muscular strength within normal limits without painon range of motion. No pain with calf compression bilateral.  Assessment and Plan:  Problem List Items Addressed This Visit    None    Visit Diagnoses    Nail fungus     -  Primary   Relevant Medications   nystatin-triamcinolone ointment (MYCOLOG)   clotrimazole (LOTRIMIN) 1 % external solution   Tinea       Relevant Medications   nystatin-triamcinolone ointment (MYCOLOG)   clotrimazole (LOTRIMIN) 1 % external solution   Left foot pain         -Examined patient -Refilled nystatin ointment to use to rash on the bottom of the left foot/tinea and prescribed clotrimazole solution to use in between her toes -Lamisil completed  -Advised good hygiene habits to prevent re-infection  -Patient to return if needed after 6 months or sooner if problems or condition worsens.  Landis Martins, DPM

## 2020-08-04 ENCOUNTER — Other Ambulatory Visit: Payer: Self-pay | Admitting: Physician Assistant

## 2020-08-04 DIAGNOSIS — I10 Essential (primary) hypertension: Secondary | ICD-10-CM

## 2020-08-04 DIAGNOSIS — U071 COVID-19: Secondary | ICD-10-CM

## 2020-08-04 NOTE — Progress Notes (Signed)
I connected by phone with Natasha [name] on 08/04/2020 at 11:23 AM to discuss the potential use of a new treatment for mild to moderate COVID-19 viral infection in non-hospitalized patients.  This patient is a 59 y.o. female that meets the FDA criteria for Emergency Use Authorization of COVID monoclonal antibody casirivimab/imdevimab.  Has a (+) direct SARS-CoV-2 viral test result  Has mild or moderate COVID-19   Is NOT hospitalized due to COVID-19  Is within 10 days of symptom onset  Has at least one of the high risk factor(s) for progression to severe COVID-19 and/or hospitalization as defined in EUA.  Specific high risk criteria : BMI > 25 and Cardiovascular disease or hypertension   I have spoken and communicated the following to the patient or parent/caregiver regarding COVID monoclonal antibody treatment:  1. FDA has authorized the emergency use for the treatment of mild to moderate COVID-19 in adults and pediatric patients with positive results of direct SARS-CoV-2 viral testing who are 34 years of age and older weighing at least 40 kg, and who are at high risk for progressing to severe COVID-19 and/or hospitalization.  2. The significant known and potential risks and benefits of COVID monoclonal antibody, and the extent to which such potential risks and benefits are unknown.  3. Information on available alternative treatments and the risks and benefits of those alternatives, including clinical trials.  4. Patients treated with COVID monoclonal antibody should continue to self-isolate and use infection control measures (e.g., wear mask, isolate, social distance, avoid sharing personal items, clean and disinfect "high touch" surfaces, and frequent handwashing) according to CDC guidelines.   5. The patient or parent/caregiver has the option to accept or refuse COVID monoclonal antibody treatment.  After reviewing this information with the patient, The patient agreed to proceed with  receiving casirivimab\imdevimab infusion and will be provided a copy of the Fact sheet prior to receiving the infusion.    [name] 08/04/2020 11:23 AM

## 2020-08-05 ENCOUNTER — Ambulatory Visit (HOSPITAL_COMMUNITY)
Admission: RE | Admit: 2020-08-05 | Discharge: 2020-08-05 | Disposition: A | Discharge status: Home / Self Care | Payer: BC Managed Care – PPO | Source: Ambulatory Visit | Attending: Pulmonary Disease | Admitting: Pulmonary Disease

## 2020-08-05 DIAGNOSIS — U071 COVID-19: Secondary | ICD-10-CM

## 2020-08-05 MED ORDER — DIPHENHYDRAMINE HCL 50 MG/ML IJ SOLN: 50.0000 mg | Freq: Once | INTRAMUSCULAR | Status: DC | PRN

## 2020-08-05 MED ORDER — SODIUM CHLORIDE 0.9 % IV SOLN: INTRAVENOUS | Status: DC | PRN

## 2020-08-05 MED ORDER — EPINEPHRINE 0.3 MG/0.3ML IJ SOAJ: 0.3000 mg | Freq: Once | INTRAMUSCULAR | Status: DC | PRN

## 2020-08-05 MED ORDER — SODIUM CHLORIDE 0.9 % IV SOLN
1200.0000 mg | Freq: Once | INTRAVENOUS | Status: AC
  Administered 2020-08-05: 1200 mg via INTRAVENOUS

## 2020-08-05 MED ORDER — METHYLPREDNISOLONE SODIUM SUCC 125 MG IJ SOLR: 125.0000 mg | Freq: Once | INTRAMUSCULAR | Status: DC | PRN

## 2020-08-05 MED ORDER — ALBUTEROL SULFATE HFA 108 (90 BASE) MCG/ACT IN AERS: 2.0000 | INHALATION_SPRAY | Freq: Once | RESPIRATORY_TRACT | Status: DC | PRN

## 2020-08-05 MED ORDER — FAMOTIDINE IN NACL 20-0.9 MG/50ML-% IV SOLN: 20.0000 mg | Freq: Once | INTRAVENOUS | Status: DC | PRN

## 2020-08-05 NOTE — Discharge Instructions (Signed)

## 2020-08-05 NOTE — Progress Notes (Signed)
  Diagnosis: COVID-19  Physician:Dr wright  Procedure: [facility] Med: casirivimab\imdevimab infusion - Provided patient with casirivimab\imdevimab fact sheet for patients, parents and caregivers prior to infusion.  Complications: No immediate complications noted.  Discharge: Discharged home   [place], [place] 08/05/2020

## 2020-09-24 ENCOUNTER — Ambulatory Visit: Payer: BC Managed Care – PPO | Admitting: Sports Medicine

## 2020-11-06 ENCOUNTER — Ambulatory Visit: Payer: BC Managed Care – PPO

## 2021-05-18 ENCOUNTER — Other Ambulatory Visit: Payer: Self-pay

## 2021-05-18 ENCOUNTER — Ambulatory Visit (INDEPENDENT_AMBULATORY_CARE_PROVIDER_SITE_OTHER): Payer: BC Managed Care – PPO

## 2021-05-18 ENCOUNTER — Ambulatory Visit
Admission: RE | Admit: 2021-05-18 | Discharge: 2021-05-18 | Disposition: A | Discharge status: Home / Self Care | Payer: BC Managed Care – PPO | Source: Ambulatory Visit | Attending: Emergency Medicine | Admitting: Emergency Medicine

## 2021-05-18 VITALS — BP 171/90 | HR 77 | Temp 98.0°F | Resp 18

## 2021-05-18 DIAGNOSIS — W19XXXA Unspecified fall, initial encounter: Secondary | ICD-10-CM | POA: Diagnosis not present

## 2021-05-18 DIAGNOSIS — S51812A Laceration without foreign body of left forearm, initial encounter: Secondary | ICD-10-CM

## 2021-05-18 DIAGNOSIS — M25532 Pain in left wrist: Secondary | ICD-10-CM | POA: Diagnosis not present

## 2021-05-18 DIAGNOSIS — T148XXA Other injury of unspecified body region, initial encounter: Secondary | ICD-10-CM

## 2021-05-18 MED ORDER — CEPHALEXIN 500 MG PO CAPS: 500.0000 mg | ORAL_CAPSULE | Freq: Four times a day (QID) | ORAL | 0 refills | Status: DC

## 2021-05-18 NOTE — ED Triage Notes (Signed)
Pt here for left wrist pain after trip and fall 3 days ago

## 2021-05-18 NOTE — ED Provider Notes (Signed)
[facility]    CSN: [id number] Arrival date & time: 05/18/21  3154      History   Chief Complaint Chief Complaint  Patient presents with   Appointment    1000   Wrist Pain    HPI Natasha [name] is a 60 y.o. female.   Pt slipped and fell on sat night over her dog emesis. Caught self with lt arm has had pain and slight swelling to the area since. Does have an abrasion to posterior side of area. Denies an fevers, no previous injury. She is going to the beach and wanted to have it looked at prior    Past Medical History:  Diagnosis Date   Allergy    Anemia    Back pain    Cancer (HCC)    HTN (hypertension)    SOB (shortness of breath) on exertion    Spinal stenosis    Torn meniscus     Patient Active Problem List   Diagnosis Date Noted   Obesity 04/27/2016   Pain in the chest 04/27/2016    Past Surgical History:  Procedure Laterality Date   CESAREAN SECTION     KNEE ARTHROSCOPY  12/20/2017   left knee   TONSILLECTOMY  1984    OB History     Gravida  2   Para  2   Term      Preterm      AB      Living  2      SAB      IAB      Ectopic      Multiple      Live Births               Home Medications    Prior to Admission medications   Medication Sig Start Date End Date Taking? Authorizing Provider  cephALEXin (KEFLEX) 500 MG capsule Take 1 capsule (500 mg total) by mouth 4 (four) times daily. 05/18/21  Yes Marney Setting, NP  clotrimazole (LOTRIMIN) 1 % external solution Apply 1 application topically 2 (two) times daily. In between toes 03/24/20   Landis Martins, DPM  ibuprofen (ADVIL) 800 MG tablet Take 800 mg by mouth every 8 (eight) hours as needed.    [provider]  methocarbamol (ROBAXIN) 500 MG tablet  02/19/20   [provider]  nystatin-triamcinolone ointment (MYCOLOG) Apply 1 application topically 2 (two) times daily. 03/24/20   Landis Martins, DPM  terbinafine (LAMISIL) 250 MG tablet Take 1  tablet (250 mg total) by mouth daily. Patient not taking: Reported on 03/24/2020 12/11/19   Landis Martins, DPM    Family History Family History  Problem Relation Age of Onset   High blood pressure Mother    High Cholesterol Mother    Heart disease Mother    Cancer Father    High blood pressure Father     Social History Social History   Tobacco Use   Smoking status: Never   Smokeless tobacco: Never  Vaping Use   Vaping Use: Never used  Substance Use Topics   Alcohol use: No   Drug use: No     Allergies   Codeine   Review of Systems Review of Systems  Constitutional: Negative.   Respiratory: Negative.    Musculoskeletal:  Positive for joint swelling.       Lt wrist slight swelling and pain , small bruise to area,   Skin:  Positive for wound.  Skin tear to posterior lt fa,   Neurological: Negative.     Physical Exam Triage Vital Signs ED Triage Vitals [05/18/21 1023]  Enc Vitals Group     BP (!) 171/90     Pulse Rate 77     Resp 18     Temp 98 F (36.7 C)     Temp Source Oral     SpO2 97 %     Weight      Height      Head Circumference      Peak Flow      Pain Score 5     Pain Loc      Pain Edu?      Excl. in [place]?    No data found.  Updated Vital Signs BP (!) 171/90 (BP Location: Left Arm)   Pulse 77   Temp 98 F (36.7 C) (Oral)   Resp 18   SpO2 97%   Visual Acuity     Physical Exam Constitutional:      Appearance: Normal appearance.  Cardiovascular:     Rate and Rhythm: Normal rate.  Musculoskeletal:        General: Swelling, tenderness and signs of injury present. Normal range of motion.     Cervical back: Normal range of motion.     Comments: Lt fa/ distal from wrist area small amount of edema with a small bruise. 2 cm skin tear abrasion noted slight erythema noted. Full ROM , strong pulses.   Skin:    Capillary Refill: Capillary refill takes less than 2 seconds.     Findings: Erythema present.  Neurological:     General: No  focal deficit present.     Mental Status: She is alert.     UC Treatments / Results  Labs (all labs ordered are listed, but only abnormal results are displayed) Labs Reviewed - No data to display  EKG   Radiology DG Wrist Complete Left  Result Date: 05/18/2021 CLINICAL DATA:  [place] 3 days ago with persistent wrist pain EXAM: LEFT WRIST - COMPLETE 3+ VIEW COMPARISON:  None. FINDINGS: There is no evidence of fracture or dislocation. There is no evidence of arthropathy or other focal bone abnormality. Soft tissues are unremarkable. IMPRESSION: Normal radiographs. Electronically Signed   By: Nelson Chimes M.D.   On: 05/18/2021 10:45    Procedures Procedures (including critical care time)  Medications Ordered in UC Medications - No data to display  Initial Impression / Assessment and Plan / UC Course  I have reviewed the triage vital signs and the nursing notes.  Pertinent labs & imaging results that were available during my care of the patient were reviewed by me and considered in my medical decision making (see chart for details).     Discussed risk for cellulitis with the open skin tear pt has had cellulitis prior and ask for script just in case since she will be out of town.  No fracture discussed RICE for wrist area  Take tylenol as needed for pain   Final Clinical Impressions(s) / UC Diagnoses   Final diagnoses:  Left wrist pain  Abrasion  Skin tear of forearm without complication, left, initial encounter   Discharge Instructions   None    ED Prescriptions     Medication Sig Dispense Auth. Provider   cephALEXin (KEFLEX) 500 MG capsule Take 1 capsule (500 mg total) by mouth 4 (four) times daily. 20 capsule Marney Setting, NP  PDMP not reviewed this encounter.   Marney Setting, NP 05/18/21 1112

## 2021-10-17 ENCOUNTER — Ambulatory Visit
Admission: EM | Admit: 2021-10-17 | Discharge: 2021-10-17 | Disposition: A | Discharge status: Home / Self Care | Payer: BC Managed Care – PPO | Attending: Physician Assistant | Admitting: Physician Assistant

## 2021-10-17 ENCOUNTER — Encounter: Payer: Self-pay | Admitting: *Deleted

## 2021-10-17 ENCOUNTER — Other Ambulatory Visit: Payer: Self-pay

## 2021-10-17 DIAGNOSIS — R051 Acute cough: Secondary | ICD-10-CM | POA: Diagnosis not present

## 2021-10-17 DIAGNOSIS — R0600 Dyspnea, unspecified: Secondary | ICD-10-CM

## 2021-10-17 HISTORY — DX: Unspecified osteoarthritis, unspecified site: M19.90

## 2021-10-17 MED ORDER — ALBUTEROL SULFATE HFA 108 (90 BASE) MCG/ACT IN AERS
1.0000 | INHALATION_SPRAY | Freq: Four times a day (QID) | RESPIRATORY_TRACT | 0 refills | Status: DC | PRN
Start: 2021-10-17 — End: 2022-02-11

## 2021-10-17 NOTE — ED Provider Notes (Signed)
[facility]    CSN: [id number] Arrival date & time: 10/17/21  1004      History   Chief Complaint Chief Complaint  Patient presents with   Nasal Congestion   Shortness of Breath    HPI Natasha [name] is a 60 y.o. female.   Patient here today for evaluation of nasal congestion, cough and shortness of breath that started yesterday.  She states that this morning she had such a severe episode of dyspnea that they called EMS.  Per patient vitals were normal except for elevated blood pressure.  She has not had any vomiting or diarrhea.  She does work in [employer], clinical.  The history is provided by the patient.  Shortness of Breath Associated symptoms: cough   Associated symptoms: no abdominal pain, no ear pain, no fever, no sore throat, no vomiting and no wheezing    Past Medical History:  Diagnosis Date   Allergy    Anemia    Arthritis    Back pain    Cancer (HCC)    HTN (hypertension)    SOB (shortness of breath) on exertion    Spinal stenosis    Torn meniscus     Patient Active Problem List   Diagnosis Date Noted   Obesity 04/27/2016   Pain in the chest 04/27/2016    Past Surgical History:  Procedure Laterality Date   CESAREAN SECTION     KNEE ARTHROSCOPY  12/20/2017   left knee   TONSILLECTOMY  1984    OB History     Gravida  2   Para  2   Term      Preterm      AB      Living  2      SAB      IAB      Ectopic      Multiple      Live Births               Home Medications    Prior to Admission medications   Medication Sig Start Date End Date Taking? Authorizing Provider  albuterol (VENTOLIN HFA) 108 (90 Base) MCG/ACT inhaler Inhale 1-2 puffs into the lungs every 6 (six) hours as needed for wheezing or shortness of breath. 10/17/21  Yes Francene Finders, PA-C  cephALEXin (KEFLEX) 500 MG capsule Take 1 capsule (500 mg total) by mouth 4 (four) times daily. 05/18/21   Marney Setting, NP  clotrimazole (LOTRIMIN) 1  % external solution Apply 1 application topically 2 (two) times daily. In between toes 03/24/20   Landis Martins, DPM  ibuprofen (ADVIL) 800 MG tablet Take 800 mg by mouth every 8 (eight) hours as needed.    [provider]  methocarbamol (ROBAXIN) 500 MG tablet  02/19/20   [provider]  nystatin-triamcinolone ointment (MYCOLOG) Apply 1 application topically 2 (two) times daily. 03/24/20   Landis Martins, DPM  terbinafine (LAMISIL) 250 MG tablet Take 1 tablet (250 mg total) by mouth daily. Patient not taking: Reported on 03/24/2020 12/11/19   Landis Martins, DPM    Family History Family History  Problem Relation Age of Onset   High blood pressure Mother    High Cholesterol Mother    Heart disease Mother    Cancer Father    High blood pressure Father     Social History Social History   Tobacco Use   Smoking status: Never    Passive exposure: Past   Smokeless tobacco: Never  Vaping Use   Vaping Use: Never used  Substance Use Topics   Alcohol use: Not Currently   Drug use: No     Allergies   Codeine   Review of Systems Review of Systems  Constitutional:  Negative for chills and fever.  HENT:  Positive for congestion. Negative for ear pain and sore throat.   Eyes:  Negative for discharge and redness.  Respiratory:  Positive for cough and shortness of breath. Negative for wheezing.   Gastrointestinal:  Negative for abdominal pain, diarrhea, nausea and vomiting.    Physical Exam Triage Vital Signs ED Triage Vitals  Enc Vitals Group     BP 10/17/21 1202 (!) 152/74     Pulse Rate 10/17/21 1202 91     Resp 10/17/21 1202 (!) 22     Temp 10/17/21 1202 99.4 F (37.4 C)     Temp Source 10/17/21 1202 Temporal     SpO2 10/17/21 1202 96 %     Weight --      Height --      Head Circumference --      Peak Flow --      Pain Score 10/17/21 1204 0     Pain Loc --      Pain Edu? --      Excl. in [place]? --    No data found.  Updated Vital Signs BP (!) 152/74    Pulse 91   Temp 99.4 F (37.4 C) (Temporal)   Resp (!) 22   SpO2 96%      Physical Exam Vitals and nursing note reviewed.  Constitutional:      General: She is not in acute distress.    Appearance: Normal appearance. She is not ill-appearing.  HENT:     Head: Normocephalic and atraumatic.     Right Ear: Tympanic membrane normal.     Left Ear: Tympanic membrane normal.     Nose: Congestion present.     Mouth/Throat:     Mouth: Mucous membranes are moist.     Pharynx: No oropharyngeal exudate or posterior oropharyngeal erythema.  Eyes:     Conjunctiva/sclera: Conjunctivae normal.  Cardiovascular:     Rate and Rhythm: Normal rate and regular rhythm.     Heart sounds: Normal heart sounds. No murmur heard. Pulmonary:     Effort: Pulmonary effort is normal. No respiratory distress.     Breath sounds: Normal breath sounds. No wheezing, rhonchi or rales.  Skin:    General: Skin is warm and dry.  Neurological:     Mental Status: She is alert.  Psychiatric:        Mood and Affect: Mood normal.        Thought Content: Thought content normal.     UC Treatments / Results  Labs (all labs ordered are listed, but only abnormal results are displayed) Labs Reviewed  COVID-19, FLU A+B NAA    EKG   Radiology No results found.  Procedures Procedures (including critical care time)  Medications Ordered in UC Medications - No data to display  Initial Impression / Assessment and Plan / UC Course  I have reviewed the triage vital signs and the nursing notes.  Pertinent labs & imaging results that were available during my care of the patient were reviewed by me and considered in my medical decision making (see chart for details).    Suspect likely viral etiology of symptoms.  Will order COVID and flu screening.  Albuterol prescribed given reported difficulty  breathing this morning with chest tightness.  This has resolved.  Patient also denies any chest pain at this time other  than with cough.  Very low suspicion for PE given lack of tachycardia and current shortness of breath but did recommend evaluation in the ED if chest pain or other symptoms worsen.  Patient expresses understanding.  Final Clinical Impressions(s) / UC Diagnoses   Final diagnoses:  Acute cough  Dyspnea, unspecified type   Discharge Instructions   None    ED Prescriptions     Medication Sig Dispense Auth. Provider   albuterol (VENTOLIN HFA) 108 (90 Base) MCG/ACT inhaler Inhale 1-2 puffs into the lungs every 6 (six) hours as needed for wheezing or shortness of breath. 8 g Francene Finders, PA-C      PDMP not reviewed this encounter.   Francene Finders, PA-C 10/17/21 1352

## 2021-10-17 NOTE — ED Triage Notes (Signed)
C/O nasal congestion, cough, dyspnea since yesterday.  States she had episode this AM where she felt very SOB - called EMS - was told VS were "all fine except for blood pressure".  Pt denies any dyspnea at rest now, but c/o dyspnea with any exertion, as well as chest pain whenever she coughs.  Denies fevers.

## 2021-10-17 NOTE — ED Notes (Signed)
Attempt to call pt in to exam room for triage - no answer.

## 2021-10-17 NOTE — ED Notes (Signed)
Pt called at number provided - no answer, voice msg left.

## 2021-10-18 LAB — COVID-19, FLU A+B NAA
Influenza A, NAA: NOT DETECTED
Influenza B, NAA: NOT DETECTED
SARS-CoV-2, NAA: DETECTED — AB

## 2021-11-22 ENCOUNTER — Other Ambulatory Visit: Payer: Self-pay

## 2021-11-22 ENCOUNTER — Emergency Department (HOSPITAL_BASED_OUTPATIENT_CLINIC_OR_DEPARTMENT_OTHER): Payer: BC Managed Care – PPO

## 2021-11-22 ENCOUNTER — Encounter (HOSPITAL_BASED_OUTPATIENT_CLINIC_OR_DEPARTMENT_OTHER): Payer: Self-pay | Admitting: Urology

## 2021-11-22 ENCOUNTER — Emergency Department (HOSPITAL_BASED_OUTPATIENT_CLINIC_OR_DEPARTMENT_OTHER)
Admission: EM | Admit: 2021-11-22 | Discharge: 2021-11-22 | Disposition: A | Discharge status: Home / Self Care | Payer: BC Managed Care – PPO | Attending: Emergency Medicine | Admitting: Emergency Medicine

## 2021-11-22 DIAGNOSIS — M79601 Pain in right arm: Secondary | ICD-10-CM | POA: Diagnosis present

## 2021-11-22 MED ORDER — KETOROLAC TROMETHAMINE 30 MG/ML IJ SOLN
30.0000 mg | Freq: Once | INTRAMUSCULAR | Status: AC
  Administered 2021-11-22: 30 mg via INTRAMUSCULAR
  Filled 2021-11-22: qty 1

## 2021-11-22 MED ORDER — DICLOFENAC SODIUM 1 % EX GEL: 2.0000 g | Freq: Four times a day (QID) | CUTANEOUS | 0 refills | Status: DC | PRN

## 2021-11-22 NOTE — ED Triage Notes (Addendum)
Right forearm pain started friday Denies any specific injury States numbness and tingling to fingers Normal temperature and radial pulses noted

## 2021-11-22 NOTE — Discharge Instructions (Signed)

## 2021-11-22 NOTE — ED Provider Notes (Signed)
Emergency Department Provider Note   I have reviewed the triage vital signs and the nursing notes.   HISTORY  Chief Complaint Arm Pain   HPI Natasha [name] is a 61 y.o. female presents to the ED with right forearm soreness. Pain radiated from the right elbow to the mid-forearm. Some pain radiating back toward the shoulder. No neck pain. No numbness/weakness. No injury. No pain with ROM of the elbow or wrist. No arm swelling.    Review of Systems  Constitutional: No fever/chills Musculoskeletal: Positive right forearm pain.  Skin: Negative for rash. Neurological: Negative for focal weakness or numbness.  ____________________________________________   PHYSICAL EXAM:  VITAL SIGNS: ED Triage Vitals  Enc Vitals Group     BP 11/22/21 1844 (!) 191/85     Pulse Rate 11/22/21 1844 67     Resp 11/22/21 1844 18     Temp 11/22/21 1844 97.9 F (36.6 C)     Temp Source 11/22/21 1844 Oral     SpO2 11/22/21 1844 100 %     Weight 11/22/21 1840 208 lb 15.9 oz (94.8 kg)     Height 11/22/21 1840 5\' 3"  (1.6 m)   Constitutional: Alert and oriented. Well appearing and in no acute distress. Eyes: Conjunctivae are normal.  Head: Atraumatic. Nose: No congestion/rhinnorhea. Mouth/Throat: Mucous membranes are moist.   Neck: No stridor. No cervical spine tenderness to palpation. Cardiovascular: Normal rate, regular rhythm. Good peripheral circulation. Grossly normal heart sounds.   Respiratory: Normal respiratory effort.  Gastrointestinal: No distention.  Musculoskeletal: Mild tenderness along the proximal and lateral forearm. No bruising. No elbow joint or wrist effusion. No cellulitis.  Neurologic:  Normal speech and language. No gross focal neurologic deficits are appreciated.  Skin:  Skin is warm, dry and intact. No rash noted.  ____________________________________________  RADIOLOGY  DG Forearm Right  Result Date: 11/22/2021 CLINICAL DATA:  Right forearm pain, numbness and  tingling in fingers EXAM: RIGHT FOREARM - 2 VIEW COMPARISON:  None. FINDINGS: Frontal and lateral views of the right forearm are obtained. No acute fracture, subluxation, or dislocation. Joint spaces are well preserved. Soft tissues are normal. IMPRESSION: 1. Unremarkable right forearm. Electronically Signed   By: Randa Ngo M.D.   On: 11/22/2021 19:32    ____________________________________________   PROCEDURES  Procedure(s) performed:   Procedures  None ____________________________________________   INITIAL IMPRESSION / ASSESSMENT AND PLAN / ED COURSE  Pertinent labs & imaging results that were available during my care of the patient were reviewed by me and considered in my medical decision making (see chart for details).    Patient Vitals for the past 24 hrs:  BP Temp Temp src Pulse Resp SpO2 Height Weight  11/22/21 1955 (!) 199/71 -- -- 70 -- 100 % -- --  11/22/21 1844 (!) 191/85 97.9 F (36.6 C) Oral 67 18 100 % -- --  11/22/21 1840 -- -- -- -- -- -- 5\' 3"  (1.6 m) 94.8 kg    Medical Decision Making: Summary:  Patient presents to the ED proximal right forearm pain. No findings to suggest fracture or dislocation. No cellulitis. No abscess. Very low clinical suspicion for DVT. No clear inciting event/factor.   The Differential Diagnoses include tennis elbow, fracture, dislocation, DVT, septic joint/bursitis.  Radiologic Tests Ordered, included plain films of the elbow.  I independently Visualized: x-ray Images, which show no fracture or dislocation    Plan for RICE and sports medicine follow up.    ____________________________________________  FINAL CLINICAL IMPRESSION(S) /  ED DIAGNOSES  Final diagnoses:  Right arm pain     MEDICATIONS GIVEN DURING THIS VISIT:  Medications  ketorolac (TORADOL) 30 MG/ML injection 30 mg (30 mg Intramuscular Given 11/22/21 2023)     NEW OUTPATIENT MEDICATIONS STARTED DURING THIS VISIT:  Discharge Medication List as of  11/22/2021  8:23 PM     START taking these medications   Details  diclofenac Sodium (VOLTAREN) 1 % GEL Apply 2 g topically 4 (four) times daily as needed., Starting Mon 11/22/2021, Normal        Note:  This document was prepared using Dragon voice recognition software and may include unintentional dictation errors.  Nanda Quinton, MD, [facility]    Vora Clover, [facility], MD 11/23/21 1225

## 2021-11-24 ENCOUNTER — Encounter: Payer: Self-pay | Admitting: Family Medicine

## 2021-11-24 ENCOUNTER — Ambulatory Visit (INDEPENDENT_AMBULATORY_CARE_PROVIDER_SITE_OTHER): Payer: BC Managed Care – PPO | Admitting: Family Medicine

## 2021-11-24 VITALS — BP 154/90 | Ht 63.0 in | Wt 230.0 lb

## 2021-11-24 DIAGNOSIS — M5412 Radiculopathy, cervical region: Secondary | ICD-10-CM | POA: Diagnosis not present

## 2021-11-24 MED ORDER — PREDNISONE 5 MG PO TABS: ORAL_TABLET | ORAL | 0 refills | Status: DC

## 2021-11-24 MED ORDER — HYDROCODONE-ACETAMINOPHEN 5-325 MG PO TABS: 1.0000 | ORAL_TABLET | Freq: Three times a day (TID) | ORAL | 0 refills | Status: DC | PRN

## 2021-11-24 NOTE — Assessment & Plan Note (Signed)
Symptoms most consistent with radicular pain.  - counseled on home exercise therapy and supportive care - prednisone  - norco - could consider physical therapy or imaging.

## 2021-11-24 NOTE — Patient Instructions (Signed)
Nice to meet you Please try heat  Please try the exercises  Please use the pain medicine as needed   Please send me a message in MyChart with any questions or updates.  Please see me back in 1-2 weeks.   --Dr. Raeford Razor

## 2021-11-24 NOTE — Progress Notes (Signed)
°  Natasha [name] - 61 y.o. female MRN [id number]  Date of birth: 13-Jul-1961  SUBJECTIVE:  Including CC & ROS.  No chief complaint on file.   Natasha [name] is a 61 y.o. female that is presenting with right arm pain. Has been ongoing for a few days. It is constant and severe in nature. No starting at her neck and extends down the right arm. No injury. No history of surgery.  Independent review of the right forearm x-ray from 9/2 shows no acute changes.   Review of Systems See HPI   HISTORY: Past Medical, Surgical, Social, and Family History Reviewed & Updated per EMR.   Pertinent Historical Findings include:  Past Medical History:  Diagnosis Date   Allergy    Anemia    Arthritis    Back pain    Cancer (HCC)    HTN (hypertension)    SOB (shortness of breath) on exertion    Spinal stenosis    Torn meniscus     Past Surgical History:  Procedure Laterality Date   CESAREAN SECTION     KNEE ARTHROSCOPY  12/20/2017   left knee   TONSILLECTOMY  1984    Family History  Problem Relation Age of Onset   High blood pressure Mother    High Cholesterol Mother    Heart disease Mother    Cancer Father    High blood pressure Father     Social History   Socioeconomic History   Marital status: Married    Spouse name: Sahana Boyland   Number of children: Not on file   Years of education: Not on file   Highest education level: Not on file  Occupational History   Occupation: Equities trader  Tobacco Use   Smoking status: Never    Passive exposure: Past   Smokeless tobacco: Never  Vaping Use   Vaping Use: Never used  Substance and Sexual Activity   Alcohol use: Not Currently   Drug use: No   Sexual activity: Not on file  Other Topics Concern   Not on file  Social History Narrative   Not on file   Social Determinants of Health   Financial Resource Strain: Not on file  Food Insecurity: Not on file  Transportation Needs: Not on file  Physical Activity: Not on file  Stress: Not  on file  Social Connections: Not on file  Intimate Partner Violence: Not on file     PHYSICAL EXAM:  VS: BP (!) 154/90 (BP Location: Left Arm, Patient Position: Sitting)    Ht 5\' 3"  (1.6 m)    Wt 230 lb (104.3 kg)    BMI 40.74 kg/m  Physical Exam Gen: NAD, alert, cooperative with exam, well-appearing   ASSESSMENT & PLAN:   Cervical radiculopathy Symptoms most consistent with radicular pain.  - counseled on home exercise therapy and supportive care - prednisone  - norco - could consider physical therapy or imaging.

## 2021-12-02 ENCOUNTER — Other Ambulatory Visit: Payer: Self-pay | Admitting: Family Medicine

## 2021-12-02 ENCOUNTER — Encounter: Payer: Self-pay | Admitting: Family Medicine

## 2021-12-02 MED ORDER — GABAPENTIN 300 MG PO CAPS: 300.0000 mg | ORAL_CAPSULE | Freq: Three times a day (TID) | ORAL | 1 refills | Status: DC

## 2021-12-13 ENCOUNTER — Ambulatory Visit (HOSPITAL_BASED_OUTPATIENT_CLINIC_OR_DEPARTMENT_OTHER)
Admission: RE | Admit: 2021-12-13 | Discharge: 2021-12-13 | Disposition: A | Discharge status: Home / Self Care | Payer: BC Managed Care – PPO | Source: Ambulatory Visit | Attending: Family Medicine | Admitting: Family Medicine

## 2021-12-13 ENCOUNTER — Ambulatory Visit: Payer: BC Managed Care – PPO | Admitting: Family Medicine

## 2021-12-13 ENCOUNTER — Other Ambulatory Visit: Payer: Self-pay

## 2021-12-13 VITALS — BP 156/90 | Ht 63.0 in | Wt 220.0 lb

## 2021-12-13 DIAGNOSIS — M5412 Radiculopathy, cervical region: Secondary | ICD-10-CM

## 2021-12-13 NOTE — Assessment & Plan Note (Addendum)
Pain acutely worsening.  She is having weakness in the right arm and hand.  Having decreased reflexes.  Symptoms most consistent with nerve impingement. -Counseled on home exercise therapy and supportive care. -X-ray. -Cervical soft collar -MRI of cervical spine to evaluate for nerve impingement and consideration of epidural use.

## 2021-12-13 NOTE — Progress Notes (Signed)
°  Natasha [name] - 61 y.o. female MRN [id number]  Date of birth: 07-29-1961  SUBJECTIVE:  Including CC & ROS.  No chief complaint on file.   Natasha [name] is a 61 y.o. female that is presenting with worsening of her right-sided radicular pain.  She is having weakness with her right hand grip.  She is also having decreased bicep reflexes in the right arm when compared to the left.  The pain is severe in nature.  She has weakness and fatigue in the right arm and shoulder.  No improvement with medications.   Review of Systems See HPI   HISTORY: Past Medical, Surgical, Social, and Family History Reviewed & Updated per EMR.   Pertinent Historical Findings include:  Past Medical History:  Diagnosis Date   Allergy    Anemia    Arthritis    Back pain    Cancer (HCC)    HTN (hypertension)    SOB (shortness of breath) on exertion    Spinal stenosis    Torn meniscus     Past Surgical History:  Procedure Laterality Date   CESAREAN SECTION     KNEE ARTHROSCOPY  12/20/2017   left knee   TONSILLECTOMY  1984     PHYSICAL EXAM:  VS: BP (!) 156/90    Ht 5\' 3"  (1.6 m)    Wt 220 lb (99.8 kg)    BMI 38.97 kg/m  Physical Exam Gen: NAD, alert, cooperative with exam, well-appearing MSK:  Neurovascularly intact       ASSESSMENT & PLAN:   Cervical radiculopathy Pain acutely worsening.  She is having weakness in the right arm and hand.  Having decreased reflexes.  Symptoms most consistent with nerve impingement. -Counseled on home exercise therapy and supportive care. -X-ray. -Cervical soft collar -MRI of cervical spine to evaluate for nerve impingement and consideration of epidural use.

## 2021-12-13 NOTE — Patient Instructions (Signed)
Good to see you I will call with the results from today  Please call [phone] to schedule the MRI   Please send me a message in MyChart with any questions or updates.  We will setup a virtual visit once the MRI is resulted.   --Dr. Raeford Razor

## 2021-12-15 ENCOUNTER — Telehealth: Payer: Self-pay | Admitting: Family Medicine

## 2021-12-15 NOTE — Telephone Encounter (Signed)
Informed of results.   Rosemarie Ax, MD [facility] 12/15/2021, 1:59 PM

## 2021-12-16 ENCOUNTER — Encounter: Payer: Self-pay | Admitting: Family Medicine

## 2021-12-17 ENCOUNTER — Other Ambulatory Visit: Payer: Self-pay | Admitting: Family Medicine

## 2021-12-17 MED ORDER — GABAPENTIN 600 MG PO TABS: 600.0000 mg | ORAL_TABLET | Freq: Three times a day (TID) | ORAL | 1 refills | Status: DC

## 2021-12-21 ENCOUNTER — Other Ambulatory Visit: Payer: Self-pay

## 2021-12-21 ENCOUNTER — Ambulatory Visit
Admission: RE | Admit: 2021-12-21 | Discharge: 2021-12-21 | Disposition: A | Discharge status: Home / Self Care | Payer: BC Managed Care – PPO | Source: Ambulatory Visit | Attending: Family Medicine | Admitting: Family Medicine

## 2021-12-21 DIAGNOSIS — M5412 Radiculopathy, cervical region: Secondary | ICD-10-CM

## 2021-12-22 ENCOUNTER — Encounter: Payer: Self-pay | Admitting: Family Medicine

## 2021-12-22 ENCOUNTER — Ambulatory Visit: Payer: BC Managed Care – PPO | Admitting: Family Medicine

## 2021-12-22 VITALS — BP 182/90 | Ht 63.0 in | Wt 220.0 lb

## 2021-12-22 DIAGNOSIS — M5441 Lumbago with sciatica, right side: Secondary | ICD-10-CM | POA: Diagnosis not present

## 2021-12-22 DIAGNOSIS — M5412 Radiculopathy, cervical region: Secondary | ICD-10-CM | POA: Diagnosis not present

## 2021-12-22 DIAGNOSIS — E559 Vitamin D deficiency, unspecified: Secondary | ICD-10-CM | POA: Insufficient documentation

## 2021-12-22 DIAGNOSIS — E2839 Other primary ovarian failure: Secondary | ICD-10-CM | POA: Diagnosis not present

## 2021-12-22 NOTE — Progress Notes (Signed)
°  Natasha [name] - 61 y.o. female MRN [id number]  Date of birth: 08-28-1961  SUBJECTIVE:  Including CC & ROS.  No chief complaint on file.   Natasha [name] is a 61 y.o. female that is following up for her right arm radicular pain.  The recent MRI of her cervical spine was showing moderate and severe spinal stenosis as well as right-sided severe neuroforaminal stenosis.  This is occurring at C5-6.  Has limited improvement with the gabapentin.  She is also experiencing right lateral lower back pain.  This is acute on chronic in nature.  It is worse with walking.  She reports a loss of her overall height.    Review of Systems See HPI   HISTORY: Past Medical, Surgical, Social, and Family History Reviewed & Updated per EMR.   Pertinent Historical Findings include:  Past Medical History:  Diagnosis Date   Allergy    Anemia    Arthritis    Back pain    Cancer (HCC)    HTN (hypertension)    SOB (shortness of breath) on exertion    Spinal stenosis    Torn meniscus     Past Surgical History:  Procedure Laterality Date   CESAREAN SECTION     KNEE ARTHROSCOPY  12/20/2017   left knee   TONSILLECTOMY  1984     PHYSICAL EXAM:  VS: BP (!) 182/90 (BP Location: Left Arm, Patient Position: Sitting)    Ht 5\' 3"  (1.6 m)    Wt 220 lb (99.8 kg)    BMI 38.97 kg/m  Physical Exam Gen: NAD, alert, cooperative with exam, well-appearing MSK:  Neurovascularly intact       ASSESSMENT & PLAN:   Cervical radiculopathy MRI was revealing for right-sided neuroforaminal stenosis that is the cause of her radicular pain. -Counseled on home exercise therapy and supportive care. -Epidural.   Acute right-sided low back pain with right-sided sciatica Acute on chronic in nature.  Does have some pain over the right SI joint.  This pain interferes with her ability to walk.   -Counseled on home exercise therapy and supportive care. -Referral to physical therapy. -Could consider SI joint injection.  Vitamin  D deficiency Check vitamin D.  Estrogen deficiency Reports a loss of her height over recent history. -Check bone density.

## 2021-12-22 NOTE — Assessment & Plan Note (Signed)
Check vitamin D. 

## 2021-12-22 NOTE — Patient Instructions (Signed)
Good to see you Please call [phone] to schedule the epidural  You should get a call about physical therapy  I will call with the results   Please send me a message in MyChart with any questions or updates.  Please see me back in 4 weeks.   --Dr. Raeford Razor

## 2021-12-22 NOTE — Assessment & Plan Note (Signed)
MRI was revealing for right-sided neuroforaminal stenosis that is the cause of her radicular pain. -Counseled on home exercise therapy and supportive care. -Epidural.

## 2021-12-22 NOTE — Assessment & Plan Note (Signed)
Acute on chronic in nature.  Does have some pain over the right SI joint.  This pain interferes with her ability to walk.   -Counseled on home exercise therapy and supportive care. -Referral to physical therapy. -Could consider SI joint injection.

## 2021-12-22 NOTE — Assessment & Plan Note (Signed)
Reports a loss of her height over recent history. -Check bone density.

## 2021-12-23 ENCOUNTER — Other Ambulatory Visit: Payer: Self-pay | Admitting: Family Medicine

## 2021-12-23 ENCOUNTER — Encounter: Payer: Self-pay | Admitting: Family Medicine

## 2021-12-23 ENCOUNTER — Ambulatory Visit (HOSPITAL_BASED_OUTPATIENT_CLINIC_OR_DEPARTMENT_OTHER)
Admission: RE | Admit: 2021-12-23 | Discharge: 2021-12-23 | Disposition: A | Discharge status: Home / Self Care | Payer: BC Managed Care – PPO | Source: Ambulatory Visit | Attending: Family Medicine | Admitting: Family Medicine

## 2021-12-23 ENCOUNTER — Other Ambulatory Visit: Payer: Self-pay

## 2021-12-23 DIAGNOSIS — E2839 Other primary ovarian failure: Secondary | ICD-10-CM | POA: Insufficient documentation

## 2021-12-23 LAB — VITAMIN D 25 HYDROXY (VIT D DEFICIENCY, FRACTURES): Vit D, 25-Hydroxy: 20.2 ng/mL — ABNORMAL LOW (ref 30.0–100.0)

## 2021-12-23 MED ORDER — VITAMIN D (ERGOCALCIFEROL) 1.25 MG (50000 UNIT) PO CAPS: 50000.0000 [IU] | ORAL_CAPSULE | ORAL | 0 refills | Status: DC

## 2021-12-27 ENCOUNTER — Other Ambulatory Visit: Payer: Self-pay

## 2021-12-27 ENCOUNTER — Ambulatory Visit
Admission: RE | Admit: 2021-12-27 | Discharge: 2021-12-27 | Disposition: A | Discharge status: Home / Self Care | Payer: BC Managed Care – PPO | Source: Ambulatory Visit | Attending: Family Medicine | Admitting: Family Medicine

## 2021-12-27 DIAGNOSIS — M5412 Radiculopathy, cervical region: Secondary | ICD-10-CM

## 2021-12-27 MED ORDER — TRIAMCINOLONE ACETONIDE 40 MG/ML IJ SUSP (RADIOLOGY)
60.0000 mg | Freq: Once | INTRAMUSCULAR | Status: AC
  Administered 2021-12-27: 60 mg via EPIDURAL

## 2021-12-27 MED ORDER — IOPAMIDOL (ISOVUE-M 300) INJECTION 61%
1.0000 mL | Freq: Once | INTRAMUSCULAR | Status: AC
  Administered 2021-12-27: 1 mL via EPIDURAL

## 2021-12-27 NOTE — Discharge Instructions (Signed)

## 2021-12-28 ENCOUNTER — Telehealth: Payer: Self-pay | Admitting: Family Medicine

## 2021-12-28 NOTE — Telephone Encounter (Signed)
Informed of results.   Rosemarie Ax, MD [facility] 12/28/2021, 1:30 PM

## 2022-01-04 ENCOUNTER — Ambulatory Visit: Payer: BC Managed Care – PPO | Admitting: Physical Therapy

## 2022-01-19 ENCOUNTER — Encounter: Payer: Self-pay | Admitting: Family Medicine

## 2022-01-19 ENCOUNTER — Ambulatory Visit: Payer: BC Managed Care – PPO | Admitting: Family Medicine

## 2022-01-19 DIAGNOSIS — M5412 Radiculopathy, cervical region: Secondary | ICD-10-CM

## 2022-01-19 NOTE — Assessment & Plan Note (Signed)
Has gotten improvement with the epidural.  Has been able to wean off of the gabapentin. ?-Counseled on home exercise therapy and supportive care. ?-Continue physical therapy. ?-Could consider repeat epidural if needed. ?

## 2022-01-19 NOTE — Progress Notes (Signed)
?  Natasha [name] - 61 y.o. female MRN [id number]  Date of birth: 1961-08-17 ? ?SUBJECTIVE:  Including CC & ROS.  ?No chief complaint on file. ? ? ?Natasha [name] is a 61 y.o. female that is following up for her radicular pain.  She has had a significant reduction in her arm pain since the epidural.  She has weaned off of the gabapentin.  She has been doing well during this time. ? ? ?Review of Systems ?See HPI  ? ?HISTORY: Past Medical, Surgical, Social, and Family History Reviewed & Updated per EMR.   ?Pertinent Historical Findings include: ? ?Past Medical History:  ?Diagnosis Date  ? Allergy   ? Anemia   ? Arthritis   ? Back pain   ? Cancer [facility])   ? HTN (hypertension)   ? SOB (shortness of breath) on exertion   ? Spinal stenosis   ? Torn meniscus   ? ? ?Past Surgical History:  ?Procedure Laterality Date  ? CESAREAN SECTION    ? KNEE ARTHROSCOPY  12/20/2017  ? left knee  ? TONSILLECTOMY  1984  ? ? ? ?PHYSICAL EXAM:  ?VS: BP (!) 156/80 (BP Location: Left Arm, Patient Position: Sitting)   Ht 5\' 3"  (1.6 m)   Wt 220 lb (99.8 kg)   BMI 38.97 kg/m?  ?Physical Exam ?Gen: NAD, alert, cooperative with exam, well-appearing ?MSK:  ?Neurovascularly intact   ? ? ? ? ?ASSESSMENT & PLAN:  ? ?Cervical radiculopathy ?Has gotten improvement with the epidural.  Has been able to wean off of the gabapentin. ?-Counseled on home exercise therapy and supportive care. ?-Continue physical therapy. ?-Could consider repeat epidural if needed. ? ? ? ? ?

## 2022-01-20 ENCOUNTER — Other Ambulatory Visit: Payer: Self-pay

## 2022-01-20 ENCOUNTER — Encounter: Payer: Self-pay | Admitting: Physical Therapy

## 2022-01-20 ENCOUNTER — Ambulatory Visit: Payer: BC Managed Care – PPO | Attending: Family Medicine | Admitting: Physical Therapy

## 2022-01-20 DIAGNOSIS — M5441 Lumbago with sciatica, right side: Secondary | ICD-10-CM | POA: Diagnosis not present

## 2022-01-20 DIAGNOSIS — R2681 Unsteadiness on feet: Secondary | ICD-10-CM | POA: Diagnosis present

## 2022-01-20 DIAGNOSIS — M6281 Muscle weakness (generalized): Secondary | ICD-10-CM | POA: Diagnosis not present

## 2022-01-20 DIAGNOSIS — R262 Difficulty in walking, not elsewhere classified: Secondary | ICD-10-CM

## 2022-01-20 DIAGNOSIS — G8929 Other chronic pain: Secondary | ICD-10-CM

## 2022-01-20 DIAGNOSIS — M545 Low back pain, unspecified: Secondary | ICD-10-CM | POA: Diagnosis present

## 2022-01-20 NOTE — Therapy (Signed)
[place] [place], [place], [address] Phone: [phone]   Fax:  [phone]  Physical Therapy Evaluation  Patient Details  Name: Natasha [name] MRN: [id number] Date of Birth: 29-Nov-1960 Referring Provider (PT): Clinton Quant Date: 01/20/2022   PT End of Session - 01/20/22 1307     Visit Number 1    Date for PT Re-Evaluation 03/17/22    PT Start Time 0847    PT Stop Time 0927    PT Time Calculation (min) 40 min    Activity Tolerance Patient tolerated treatment well    Behavior During Therapy [facility] for tasks assessed/performed             Past Medical History:  Diagnosis Date   Allergy    Anemia    Arthritis    Back pain    Cancer ([place])    HTN (hypertension)    SOB (shortness of breath) on exertion    Spinal stenosis    Torn meniscus     Past Surgical History:  Procedure Laterality Date   CESAREAN SECTION     KNEE ARTHROSCOPY  12/20/2017   left knee   TONSILLECTOMY  1984    There were no vitals filed for this visit.    Subjective Assessment - 01/20/22 0850     Subjective Pateint reports severe, chronic LBP, limits her ability to walk. Hurts more on her R side, no sciatica. She feels like its due to wear and tear. She feels her core is very weak due to not being able to exercise. She has chronic neck pain, but recently received an injection which has helped with her neck pain. She reports that it is difficult to move sit to stand and that if she steps hard, like on a step or a curb, she will be in pain for multiple days. She coincidently reports dizziness which may be vestibular due to it ocurs when she moves her head down and to the R.    Pertinent History 1. Multilevel cervical disc degeneration, worst at C5-6 where there  is moderate to severe spinal stenosis and moderate to severe right  neural foraminal stenosis.  2. Moderate spinal stenosis at C4-5.  3. Moderate neural foraminal stenosis  bilaterally at C6-7 and on the  right at C7-T1.    How long can you sit comfortably? As long as she has back support she is ok.    How long can you stand comfortably? Tolerates less than 5 mnutes.    How long can you walk comfortably? Tolerates less than 5 minutes of walking before requiring a break due to pain.    Patient Stated Goals Be able to move and keep up with family activities without back pain.    Currently in Pain? Yes    Pain Score 8     Pain Location Back    Pain Orientation Right;Left;Lower    Pain Descriptors / Indicators Aching;Sharp    Pain Type Chronic pain    Pain Radiating Towards Does not radiate.    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 she has to use the rail and step to gait pattern, very slow. [place] that is also 3 stories, Has rails for all steps at [employer].      Prior Function   Level of Independence Independent    Vocation Part time employment    Vocation Requirements Works Flu clinics, mostly sitting    Leisure Walking on the [place], Walking downtown.      Cognition   Overall  Cognitive Status Within Functional Limits for tasks assessed      Sensation   Light Touch Impaired by gross assessment    Additional Comments diminished on R lateral and posterior LE      ROM / Strength   AROM / PROM / Strength AROM;Strength      AROM   AROM Assessment Site Lumbar    Lumbar Flexion 80%    Lumbar Extension 50    Lumbar - Right Side Bend 40    Lumbar - Left Side Bend 50    Lumbar - Right Rotation 70    Lumbar - Left Rotation 70      Strength   Overall Strength Comments Patient demosntrates B hip weakness, 3/5      Flexibility   Soft Tissue Assessment /Muscle Length yes    Hamstrings SLR> 70 degrees B.      Palpation   Palpation comment TTPover R SI joint, no spasm or TTP in gluts, piriformis on either side.      Transfers   Five time sit to stand comments  15.38                        Objective measurements completed on examination: See above findings.                PT Education - 01/20/22 1307     Education Details POC    Person(s) Educated Patient    Methods Explanation    Comprehension Verbalized understanding              PT Short Term Goals - 01/20/22 1316       PT SHORT TERM GOAL #1   Title I with initial HEP    Time 4    Period Weeks    Status New    Target Date 02/17/22               PT Long Term Goals - 01/20/22 1445       PT LONG TERM GOAL #1   Title I with final HEP    Time 8    Period Weeks    Status New    Target Date 03/17/22      PT LONG TERM  GOAL #2   Title Paitent will demosntrate lumbar ROM at least 80% in all directions iwth pain < 3/10    Time 8    Period Weeks    Status New    Target Date 03/17/22      PT LONG TERM GOAL #3   Title Patient will ambulate up and down at least 12 steps with steadying use of handrail, relying fully on LE stregnth, but may use step to gait.    Time 8    Period Weeks    Status New    Target Date 03/17/22      PT LONG TERM GOAL #4   Title  Patient will be able to walk x at least 15 minutes with back pain < 3/10    Time 8    Period Weeks    Status New    Target Date 03/17/22                    Plan - 01/20/22 1308     Clinical Impression Statement Patient arrives to PT with C/O LBP. 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 She has recently had to change jobs to a more sedentary job due to the pain. She reports severe pain in R SI area with any standing, has great difficulty walking or managing steps. She also reports that she cannot sleep in a regular bed due to the pain. She demonstrates flexed, posture with rounded shoudlers, weaness in her hips, and chronic cervical degeneration. She does not appear to have muscle spasm in her hips, but is very tender directly over her R SI. She will benefit from PT to further assess her SI joint, facilitate pain relief, and initiate stretching and strengthening program to stabilize trunk and improve posture to manage chronic pain.    Personal Factors and Comorbidities Past/Current Experience;Fitness;Comorbidity 1;Profession    Comorbidities chronic multilevel cervical disc degeneration    Examination-Activity Limitations Reach Overhead;Locomotion Level;Transfers;Bend;Caring for Others;Carry;Sleep;Lift;Stand;Stairs    Examination-Participation Restrictions Occupation;Cleaning;Community Activity;Driving;Shop;Yard Work;Laundry             Patient will benefit from skilled therapeutic intervention in order to improve the following deficits and impairments:  Abnormal gait, Decreased range of motion, Difficulty walking, Pain, Decreased strength, Decreased mobility, Postural dysfunction, Improper body mechanics, Impaired flexibility  Visit Diagnosis: Muscle weakness (generalized)  Chronic right-sided low back pain without sciatica  Difficulty in walking, not elsewhere classified  Unsteadiness on feet     Problem List Patient Active Problem  List   Diagnosis Date Noted   Acute right-sided low back pain with right-sided sciatica 12/22/2021   Vitamin D deficiency 12/22/2021   Estrogen deficiency 12/22/2021   Cervical radiculopathy 11/24/2021   Obesity 04/27/2016   Pain in the chest 04/27/2016    Marcelina Morel, DPT 01/20/2022, 2:51 PM  [place] [place], [place], [address] Phone: [phone]   Fax:  [phone]  Name: Natasha [name] MRN: [id number] Date of Birth: 1961-04-22

## 2022-01-26 ENCOUNTER — Encounter: Payer: Self-pay | Admitting: Physical Therapy

## 2022-01-26 ENCOUNTER — Other Ambulatory Visit: Payer: Self-pay

## 2022-01-26 ENCOUNTER — Ambulatory Visit: Payer: BC Managed Care – PPO | Admitting: Physical Therapy

## 2022-01-26 DIAGNOSIS — M6281 Muscle weakness (generalized): Secondary | ICD-10-CM | POA: Diagnosis not present

## 2022-01-26 DIAGNOSIS — G8929 Other chronic pain: Secondary | ICD-10-CM

## 2022-01-26 DIAGNOSIS — R2681 Unsteadiness on feet: Secondary | ICD-10-CM

## 2022-01-26 DIAGNOSIS — R262 Difficulty in walking, not elsewhere classified: Secondary | ICD-10-CM

## 2022-01-26 DIAGNOSIS — M545 Low back pain, unspecified: Secondary | ICD-10-CM

## 2022-01-26 NOTE — Patient Instructions (Signed)
Access Code: [email or web address] ?URL: [email or web address] ?Date: 01/26/2022 ?Prepared by: Ethel Rana ? ?Program Notes ?All stretches, 3 x 15 second holds, all strength, try 2 x 10 reps, but only if you can hold good form throughout. ? ? ?Exercises ?Supine Hamstring Stretch with Strap - 1 x daily - 7 x weekly - 10 reps ?Supine ITB Stretch with Strap - 1 x daily - 7 x weekly - 3 sets - 10 reps ?Hip Adductors and Hamstring Stretch with Strap - 1 x daily - 7 x weekly - 3 sets - 10 reps ?Supine Double Knee to Chest - 1 x daily - 7 x weekly - 3 sets - 10 reps ?Supine Figure 4 Piriformis Stretch - 1 x daily - 7 x weekly - 3 sets - 10 reps ?Supine Bridge with Resistance Band - 1 x daily - 7 x weekly - 3 sets - 10 reps ?Supine March - 1 x daily - 7 x weekly - 3 sets - 10 reps ?Standing Heel Raise with Support - 1 x daily - 7 x weekly - 3 sets - 10 reps ?Standing Hip Abduction - 1 x daily - 7 x weekly - 3 sets - 10 reps ? ?

## 2022-01-26 NOTE — Therapy (Signed)
[facility] ?[place], [place], [address] ?Phone: [phone]   Fax:  [phone] ? ?Physical Therapy Treatment ? ?Patient Details  ?Name: Natasha [name] ?MRN: [id number] ?Date of Birth: 03-18-1961 ?Referring Provider (PT): Raeford Razor ? ? ?Encounter Date: 01/26/2022 ? ? PT End of Session - 01/26/22 1852   ? ? Visit Number 2   ? Date for PT Re-Evaluation 03/17/22   ? PT Start Time 1802   ? PT Stop Time 1850   ? PT Time Calculation (min) 48 min   ? Activity Tolerance Patient tolerated treatment well   ? Behavior During Therapy [facility] for tasks assessed/performed   ? ?  ?  ? ?  ? ? ?Past Medical History:  ?Diagnosis Date  ? Allergy   ? Anemia   ? Arthritis   ? Back pain   ? Cancer [facility])   ? HTN (hypertension)   ? SOB (shortness of breath) on exertion   ? Spinal stenosis   ? Torn meniscus   ? ? ?Past Surgical History:  ?Procedure Laterality Date  ? CESAREAN SECTION    ? KNEE ARTHROSCOPY  12/20/2017  ? left knee  ? TONSILLECTOMY  1984  ? ? ?There were no vitals filed for this visit. ? ? Subjective Assessment - 01/26/22 1802   ? ? Subjective Patient reports no changes in back pain.   ? Pertinent History 1. Multilevel cervical disc degeneration, worst at C5-6 where there  is moderate to severe spinal stenosis and moderate to severe right  neural foraminal stenosis.  2. Moderate spinal stenosis at C4-5.  3. Moderate neural foraminal stenosis bilaterally at C6-7 and on the  right at C7-T1.   ? Currently in Pain? Yes   ? Pain Score 2    ? Pain Location Back   ? Pain Orientation Right;Left;Lower   ? Pain Descriptors / Indicators Aching;Sharp   ? Pain Type Chronic pain   ? Pain Onset More than a month ago   ? Pain Frequency Intermittent   ? Aggravating Factors  Sitting for too long. When she tries to stand, she feels pain.   ? ?  ?  ? ?  ? ? ? ? ? ? ? ? ? ? ? ? ? ? ? ? ? ? ? ? [facility] PT Treatment/Exercise - 01/26/22 0001   ? ?  ? Exercises  ? Exercises Lumbar   ?  ?  Lumbar Exercises: Stretches  ? Active Hamstring Stretch Right;Left;3 reps;10 seconds   ? Passive Hamstring Stretch Right;Left;2 reps;20 seconds   ? Passive Hamstring Stretch Limitations Stretching strap   ? ITB Stretch Right;Left;3 reps;20 seconds   ? ITB Stretch Limitations Also hip adductor stretch-Stretching strap for both.   ? Other Lumbar Stretch Exercise flexion over physioball, 3 x 15 seconds.   ?  ? Lumbar Exercises: Aerobic  ? Nustep L4 x 5 min warm up.   ?  ? Lumbar Exercises: Standing  ? Heel Raises 20 reps;2 seconds   ? Other Standing Lumbar Exercises Hip abduction, 2 x10 reps each, emphasize posture.   ?  ? Lumbar Exercises: Supine  ? Pelvic Tilt 5 reps;5 seconds   ? Bridge with clamshell Non-compliant;10 reps;1 second   Green Tband  ? Other Supine Lumbar Exercises Supine marching with PPT-10 reps each leg   ? ?  ?  ? ?  ? ? ? ? ? ? ? ? ? ? PT Education - 01/26/22 1850   ? ?  Education Details HEP, PPT, posture during exercises.   ? Person(s) Educated Patient   ? Methods Explanation;Demonstration;Handout   ? Comprehension Verbalized understanding   ? ?  ?  ? ?  ? ? ? PT Short Term Goals - 01/26/22 1828   ? ?  ? PT SHORT TERM GOAL #1  ? Title I with initial HEP   ? Time 3   ? Period Weeks   ? Status On-going   ? Target Date 02/17/22   ? ?  ?  ? ?  ? ? ? ? PT Long Term Goals - 01/20/22 1445   ? ?  ? PT LONG TERM GOAL #1  ? Title I with final HEP   ? Time 8   ? Period Weeks   ? Status New   ? Target Date 03/17/22   ?  ? PT LONG TERM GOAL #2  ? Title Paitent will demosntrate lumbar ROM at least 80% in all directions iwth pain < 3/10   ? Time 8   ? Period Weeks   ? Status New   ? Target Date 03/17/22   ?  ? PT LONG TERM GOAL #3  ? Title Patient will ambulate up and down at least 12 steps with steadying use of handrail, relying fully on LE stregnth, but may use step to gait.   ? Time 8   ? Period Weeks   ? Status New   ? Target Date 03/17/22   ?  ? PT LONG TERM GOAL #4  ? Title Patient will be able to walk x  at least 15 minutes with back pain < 3/10   ? Time 8   ? Period Weeks   ? Status New   ? Target Date 03/17/22   ? ?  ?  ? ?  ? ? ? ? ? ? ? ? Plan - 01/26/22 1826   ? ? Clinical Impression Statement Patient reports no changes in back pain. Initiated HEP including stretching and trunk strengthening/stability activities. She participated well and tolerate most exercises without C/O LBP.   ? Personal Factors and Comorbidities Past/Current Experience;Fitness;Comorbidity 1;Profession   ? Comorbidities chronic multilevel cervical disc degeneration   ? Examination-Activity Limitations Reach Overhead;Locomotion Level;Transfers;Bend;Caring for Others;Carry;Sleep;Lift;Stand;Stairs   ? Stability/Clinical Decision Making Stable/Uncomplicated   ? Clinical Decision Making Moderate   ? Rehab Potential Good   ? PT Frequency 2x / week   ? PT Duration 8 weeks   ? PT Treatment/Interventions ADLs/Self Care Home Management;Iontophoresis '4mg'$ /ml Dexamethasone;Gait training;Stair training;Patient/family education;Functional mobility training;Passive range of motion;Dry needling;Manual techniques;Neuromuscular re-education;Balance training;Therapeutic exercise;Therapeutic activities;Moist Heat;Electrical Stimulation   ? PT Next Visit Plan Progress with trunk stability, LE strength.   ? PT Home Exercise Plan [email or web address]   ? Consulted and Agree with Plan of Care Patient   ? ?  ?  ? ?  ? ? ?Patient will benefit from skilled therapeutic intervention in order to improve the following deficits and impairments:  Abnormal gait, Decreased range of motion, Difficulty walking, Pain, Decreased strength, Decreased mobility, Postural dysfunction, Improper body mechanics, Impaired flexibility ? ?Visit Diagnosis: ?Muscle weakness (generalized) ? ?Chronic right-sided low back pain without sciatica ? ?Difficulty in walking, not elsewhere classified ? ?Unsteadiness on feet ? ? ? ? ?Problem List ?Patient Active Problem List  ? Diagnosis Date Noted  ? Acute  right-sided low back pain with right-sided sciatica 12/22/2021  ? Vitamin D deficiency 12/22/2021  ? Estrogen deficiency 12/22/2021  ? Cervical radiculopathy 11/24/2021  ? Obesity  04/27/2016  ? Pain in the chest 04/27/2016  ? ? ?Marcelina Morel, DPT ?01/26/2022, 6:55 PM ? ?[facility] ?[address] ?[address] ?[place], [place], [address] ?Phone: [phone]   Fax:  [phone] ? ?Name: Natasha [name] ?MRN: [id number] ?Date of Birth: 1961/03/12 ? ? ? ?

## 2022-01-31 ENCOUNTER — Ambulatory Visit: Payer: BC Managed Care – PPO | Admitting: Physical Therapy

## 2022-01-31 ENCOUNTER — Encounter: Payer: Self-pay | Admitting: Physical Therapy

## 2022-01-31 ENCOUNTER — Other Ambulatory Visit: Payer: Self-pay

## 2022-01-31 DIAGNOSIS — R2681 Unsteadiness on feet: Secondary | ICD-10-CM

## 2022-01-31 DIAGNOSIS — M6281 Muscle weakness (generalized): Secondary | ICD-10-CM | POA: Diagnosis not present

## 2022-01-31 DIAGNOSIS — R262 Difficulty in walking, not elsewhere classified: Secondary | ICD-10-CM

## 2022-01-31 DIAGNOSIS — M545 Low back pain, unspecified: Secondary | ICD-10-CM

## 2022-01-31 DIAGNOSIS — G8929 Other chronic pain: Secondary | ICD-10-CM

## 2022-01-31 NOTE — Therapy (Signed)
[place] ?[place] ?[address] ?[place], [place], [address] ?Phone: [phone]   Fax:  [phone] ? ?Physical Therapy Treatment ? ?Patient Details  ?Name: Natasha [name] ?MRN: [id number] ?Date of Birth: 1960/11/29 ?Referring Provider (PT): Raeford Razor ? ? ?Encounter Date: 01/31/2022 ? ? PT End of Session - 01/31/22 1146   ? ? Visit Number 3   ? Date for PT Re-Evaluation 03/17/22   ? PT Start Time 1100   ? PT Stop Time 1145   ? PT Time Calculation (min) 45 min   ? Activity Tolerance Patient tolerated treatment well   ? Behavior During Therapy [facility] for tasks assessed/performed   ? ?  ?  ? ?  ? ? ?Past Medical History:  ?Diagnosis Date  ? Allergy   ? Anemia   ? Arthritis   ? Back pain   ? Cancer [facility])   ? HTN (hypertension)   ? SOB (shortness of breath) on exertion   ? Spinal stenosis   ? Torn meniscus   ? ? ?Past Surgical History:  ?Procedure Laterality Date  ? CESAREAN SECTION    ? KNEE ARTHROSCOPY  12/20/2017  ? left knee  ? TONSILLECTOMY  1984  ? ? ?There were no vitals filed for this visit. ? ? Subjective Assessment - 01/31/22 1101   ? ? Subjective "Its just a chronic thing" Im feeling ok   ? Currently in Pain? No/denies   sitting  ? ?  ?  ? ?  ? ? ? ? ? ? ? ? ? ? ? ? ? ? ? ? ? ? ? ? [facility] Adult PT Treatment/Exercise - 01/31/22 0001   ? ?  ? Lumbar Exercises: Aerobic  ? Nustep L5 x 6 min warm up.   ?  ? Lumbar Exercises: Machines for Strengthening  ? Other Lumbar Machine Exercise Rows & lats 25lb 2x10   ?  ? Lumbar Exercises: Standing  ? Shoulder Extension Strengthening;Power Tower;Theraband   ? Shoulder Extension Limitations 10   ?  ? Lumbar Exercises: Seated  ? Sit to Stand 10 reps   x2, OHP yellow ball  ?  ? Lumbar Exercises: Supine  ? Ab Set 20 reps;2 seconds   ? Bridge with clamshell Compliant;10 reps;2 seconds   ? Other Supine Lumbar Exercises LE on Pball bridges, K2C, Oblq x10 each   ? ?  ?  ? ?  ? ? ? ? ? ? ? ? ? ? ? ? PT Short Term Goals - 01/26/22 1828   ? ?  ? PT  SHORT TERM GOAL #1  ? Title I with initial HEP   ? Time 3   ? Period Weeks   ? Status On-going   ? Target Date 02/17/22   ? ?  ?  ? ?  ? ? ? ? PT Long Term Goals - 01/20/22 1445   ? ?  ? PT LONG TERM GOAL #1  ? Title I with final HEP   ? Time 8   ? Period Weeks   ? Status New   ? Target Date 03/17/22   ?  ? PT LONG TERM GOAL #2  ? Title Paitent will demosntrate lumbar ROM at least 80% in all directions iwth pain < 3/10   ? Time 8   ? Period Weeks   ? Status New   ? Target Date 03/17/22   ?  ? PT LONG TERM GOAL #3  ? Title Patient will ambulate  up and down at least 12 steps with steadying use of handrail, relying fully on LE stregnth, but may use step to gait.   ? Time 8   ? Period Weeks   ? Status New   ? Target Date 03/17/22   ?  ? PT LONG TERM GOAL #4  ? Title Patient will be able to walk x at least 15 minutes with back pain < 3/10   ? Time 8   ? Period Weeks   ? Status New   ? Target Date 03/17/22   ? ?  ?  ? ?  ? ? ? ? ? ? ? ? Plan - 01/31/22 1147   ? ? Clinical Impression Statement Pt did well with a progressed treatment session that focused on postural and core strength. Cue for core engagement needed throughout session. Tactile cues to prevent trunk flexion given with shoulder extensions. No report of increase LBP throughout session.   ? Personal Factors and Comorbidities Past/Current Experience;Fitness;Comorbidity 1;Profession   ? Examination-Activity Limitations Reach Overhead;Locomotion Level;Transfers;Bend;Caring for Others;Carry;Sleep;Lift;Stand;Stairs   ? Examination-Participation Restrictions Occupation;Cleaning;Community Activity;Driving;Shop;Yard Work;Laundry   ? Stability/Clinical Decision Making Stable/Uncomplicated   ? Rehab Potential Good   ? PT Frequency 2x / week   ? PT Duration 8 weeks   ? PT Treatment/Interventions ADLs/Self Care Home Management;Iontophoresis '4mg'$ /ml Dexamethasone;Gait training;Stair training;Patient/family education;Functional mobility training;Passive range of motion;Dry  needling;Manual techniques;Neuromuscular re-education;Balance training;Therapeutic exercise;Therapeutic activities;Moist Heat;Electrical Stimulation   ? PT Next Visit Plan Progress with trunk stability, LE strength.   ? ?  ?  ? ?  ? ? ?Patient will benefit from skilled therapeutic intervention in order to improve the following deficits and impairments:  Abnormal gait, Decreased range of motion, Difficulty walking, Pain, Decreased strength, Decreased mobility, Postural dysfunction, Improper body mechanics, Impaired flexibility ? ?Visit Diagnosis: ?Muscle weakness (generalized) ? ?Difficulty in walking, not elsewhere classified ? ?Unsteadiness on feet ? ?Chronic right-sided low back pain without sciatica ? ? ? ? ?Problem List ?Patient Active Problem List  ? Diagnosis Date Noted  ? Acute right-sided low back pain with right-sided sciatica 12/22/2021  ? Vitamin D deficiency 12/22/2021  ? Estrogen deficiency 12/22/2021  ? Cervical radiculopathy 11/24/2021  ? Obesity 04/27/2016  ? Pain in the chest 04/27/2016  ? ? ?Scot Jun, PTA ?01/31/2022, 11:50 AM ? ?[place] ?[address] ?[address] ?[place], [place], [address] ?Phone: [phone]   Fax:  [phone] ? ?Name: Natasha [name] ?MRN: [id number] ?Date of Birth: 09-27-1961 ? ? ? ?

## 2022-02-02 ENCOUNTER — Ambulatory Visit: Payer: BC Managed Care – PPO | Admitting: Physical Therapy

## 2022-02-02 ENCOUNTER — Other Ambulatory Visit: Payer: Self-pay

## 2022-02-02 DIAGNOSIS — M6281 Muscle weakness (generalized): Secondary | ICD-10-CM | POA: Diagnosis not present

## 2022-02-02 DIAGNOSIS — M545 Low back pain, unspecified: Secondary | ICD-10-CM

## 2022-02-02 DIAGNOSIS — R2681 Unsteadiness on feet: Secondary | ICD-10-CM

## 2022-02-02 DIAGNOSIS — R262 Difficulty in walking, not elsewhere classified: Secondary | ICD-10-CM

## 2022-02-02 NOTE — Therapy (Signed)
[place] ?[place] ?[address] ?[place], [place], [address] ?Phone: [phone]   Fax:  [phone] ? ?Physical Therapy Treatment ? ?Patient Details  ?Name: Natasha [name] ?MRN: [id number] ?Date of Birth: 23-Aug-1961 ?Referring Provider (PT): Raeford Razor ? ? ?Encounter Date: 02/02/2022 ? ? PT End of Session - 02/02/22 1052   ? ? Visit Number 4   ? Date for PT Re-Evaluation 03/17/22   ? PT Start Time 1017   ? PT Stop Time 1100   ? PT Time Calculation (min) 43 min   ? Activity Tolerance Patient tolerated treatment well   ? Behavior During Therapy [facility] for tasks assessed/performed   ? ?  ?  ? ?  ? ? ?Past Medical History:  ?Diagnosis Date  ? Allergy   ? Anemia   ? Arthritis   ? Back pain   ? Cancer [facility])   ? HTN (hypertension)   ? SOB (shortness of breath) on exertion   ? Spinal stenosis   ? Torn meniscus   ? ? ?Past Surgical History:  ?Procedure Laterality Date  ? CESAREAN SECTION    ? KNEE ARTHROSCOPY  12/20/2017  ? left knee  ? TONSILLECTOMY  1984  ? ? ?There were no vitals filed for this visit. ? ? Subjective Assessment - 02/02/22 1024   ? ? Subjective Patient reports no change inher LBP during gait and standing. She is a little frustrated. Walking on the beach this weekend caused severe pain and tightness all across lower back.   ? Currently in Pain? No/denies   ? ?  ?  ? ?  ? ? ? ? ? ? ? ? ? ? ? ? ? ? ? ? ? ? ? ? [facility] Adult PT Treatment/Exercise - 02/02/22 0001   ? ?  ? Lumbar Exercises: Stretches  ? Other Lumbar Stretch Exercise Patient performed self deep tissue massage to R QL using a tennis ball, then followed with florward flexion in sititng using a rolling stool.   ?  ? Lumbar Exercises: Aerobic  ? Nustep L5 x 6 min warm up.   ?  ? Modalities  ? Modalities Cryotherapy;Electrical Stimulation   ?  ? Cryotherapy  ? Number Minutes Cryotherapy 10 Minutes   ? Cryotherapy Location Lumbar Spine   ? Type of Cryotherapy Ice pack   ?  ? Electrical Stimulation  ? Electrical  Stimulation Location low back, R SI and QL   ? Electrical Stimulation Action IFC   ? Electrical Stimulation Parameters as tolerated.   ? Electrical Stimulation Goals Pain   ?  ? Manual Therapy  ? Manual Therapy Soft tissue mobilization;Passive ROM   ? Soft tissue mobilization Deep STM using tennis ball and hands along r QL and lumbar prarspinals.   ? Passive ROM Side lying knee to chest as therapist provided deep pressure along R QL   ? ?  ?  ? ?  ? ? ? ? ? ? ? ? ? ? PT Education - 02/02/22 1052   ? ? Education Details Self Deep Tissue Massage using a tennis ball, Emphasize stretching to low back and continue stregnthening fo rlong term relief.   ? Person(s) Educated Patient   ? Methods Explanation   ? Comprehension Returned demonstration;Verbalized understanding   ? ?  ?  ? ?  ? ? ? PT Short Term Goals - 01/26/22 1828   ? ?  ? PT SHORT TERM GOAL #1  ? Title I with  initial HEP   ? Time 3   ? Period Weeks   ? Status On-going   ? Target Date 02/17/22   ? ?  ?  ? ?  ? ? ? ? PT Long Term Goals - 02/02/22 1055   ? ?  ? PT LONG TERM GOAL #1  ? Title I with final HEP   ? Time 8   ? Period Weeks   ? Status New   ? Target Date 03/17/22   ?  ? PT LONG TERM GOAL #2  ? Title Paitent will demosntrate lumbar ROM at least 80% in all directions iwth pain < 3/10   ? Time 8   ? Period Weeks   ? Status New   ? Target Date 03/17/22   ?  ? PT LONG TERM GOAL #3  ? Title Patient will ambulate up and down at least 12 steps with steadying use of handrail, relying fully on LE stregnth, but may use step to gait.   ? Time 8   ? Period Weeks   ? Status New   ? Target Date 03/17/22   ?  ? PT LONG TERM GOAL #4  ? Title Patient will be able to walk x at least 15 minutes with back pain < 3/10   ? Baseline Patient reproted severe pain in low back while walking on the beach this weekend.   ? Time 7   ? Period Weeks   ? Status On-going   ? Target Date 03/17/22   ? ?  ?  ? ?  ? ? ? ? ? ? ? ? Plan - 02/02/22 1053   ? ? Clinical Impression Statement  Patient reported continued pain in low back, frustrated. Tried walking on the beach this weekend with increased pain all across low back. Treatment focused on STM and stretch, emphasiing R QL due to spasm and TTP on R. Finished with ice and e-stime for local pain relief.   ? Personal Factors and Comorbidities Past/Current Experience;Fitness;Comorbidity 1;Profession   ? Examination-Activity Limitations Reach Overhead;Locomotion Level;Transfers;Bend;Caring for Others;Carry;Sleep;Lift;Stand;Stairs   ? Examination-Participation Restrictions Occupation;Cleaning;Community Activity;Driving;Shop;Yard Work;Laundry   ? Stability/Clinical Decision Making Stable/Uncomplicated   ? Clinical Decision Making Moderate   ? Rehab Potential Good   ? PT Frequency 2x / week   ? PT Duration 8 weeks   ? PT Treatment/Interventions ADLs/Self Care Home Management;Iontophoresis '4mg'$ /ml Dexamethasone;Gait training;Stair training;Patient/family education;Functional mobility training;Passive range of motion;Dry needling;Manual techniques;Neuromuscular re-education;Balance training;Therapeutic exercise;Therapeutic activities;Moist Heat;Electrical Stimulation   ? PT Next Visit Plan Progress with trunk stability, LE strength.   ? PT Home Exercise Plan [id number]   ? Consulted and Agree with Plan of Care Patient   ? ?  ?  ? ?  ? ? ?Patient will benefit from skilled therapeutic intervention in order to improve the following deficits and impairments:  Abnormal gait, Decreased range of motion, Difficulty walking, Pain, Decreased strength, Decreased mobility, Postural dysfunction, Improper body mechanics, Impaired flexibility ? ?Visit Diagnosis: ?Muscle weakness (generalized) ? ?Difficulty in walking, not elsewhere classified ? ?Unsteadiness on feet ? ?Chronic right-sided low back pain without sciatica ? ? ? ? ?Problem List ?Patient Active Problem List  ? Diagnosis Date Noted  ? Acute right-sided low back pain with right-sided sciatica 12/22/2021  ? Vitamin  D deficiency 12/22/2021  ? Estrogen deficiency 12/22/2021  ? Cervical radiculopathy 11/24/2021  ? Obesity 04/27/2016  ? Pain in the chest 04/27/2016  ? ? ?Marcelina Morel, DPT ?02/02/2022, 10:56 AM ? ?[place] ?  [address] ?[address] ?[place], [place], [address] ?Phone: [phone]   Fax:  [phone] ? ?Name: Natasha [name] ?MRN: [id number] ?Date of Birth: 08-22-1961 ? ? ? ?

## 2022-02-07 ENCOUNTER — Ambulatory Visit: Payer: BC Managed Care – PPO | Admitting: Physical Therapy

## 2022-02-09 ENCOUNTER — Encounter: Payer: Self-pay | Admitting: Physical Therapy

## 2022-02-09 ENCOUNTER — Other Ambulatory Visit: Payer: Self-pay

## 2022-02-09 ENCOUNTER — Ambulatory Visit: Payer: BC Managed Care – PPO | Admitting: Physical Therapy

## 2022-02-09 DIAGNOSIS — M6281 Muscle weakness (generalized): Secondary | ICD-10-CM

## 2022-02-09 DIAGNOSIS — M545 Low back pain, unspecified: Secondary | ICD-10-CM

## 2022-02-09 DIAGNOSIS — R262 Difficulty in walking, not elsewhere classified: Secondary | ICD-10-CM

## 2022-02-09 NOTE — Therapy (Signed)
[place] ?[place] ?[address] ?[place], [place], [address] ?Phone: [phone]   Fax:  [phone] ? ?Physical Therapy Treatment ? ?Patient Details  ?Name: Natasha [name] ?MRN: [id number] ?Date of Birth: 1961-05-05 ?Referring Provider (PT): Raeford Razor ? ? ?Encounter Date: 02/09/2022 ? ? PT End of Session - 02/09/22 1222   ? ? Visit Number 5   ? Date for PT Re-Evaluation 03/17/22   ? PT Start Time 1145   ? PT Stop Time 1225   ? PT Time Calculation (min) 40 min   ? Activity Tolerance Patient tolerated treatment well   ? Behavior During Therapy [facility] for tasks assessed/performed   ? ?  ?  ? ?  ? ? ?Past Medical History:  ?Diagnosis Date  ? Allergy   ? Anemia   ? Arthritis   ? Back pain   ? Cancer [facility])   ? HTN (hypertension)   ? SOB (shortness of breath) on exertion   ? Spinal stenosis   ? Torn meniscus   ? ? ?Past Surgical History:  ?Procedure Laterality Date  ? CESAREAN SECTION    ? KNEE ARTHROSCOPY  12/20/2017  ? left knee  ? TONSILLECTOMY  1984  ? ? ?There were no vitals filed for this visit. ? ? Subjective Assessment - 02/09/22 1146   ? ? Subjective No change overall really. Better than she was Monday and Tuesday.   ? Currently in Pain? Yes   ? Pain Score 2    ? Pain Location Back   ? ?  ?  ? ?  ? ? ? ? ? ? ? ? ? ? ? ? ? ? ? ? ? ? ? ? [facility] Adult PT Treatment/Exercise - 02/09/22 0001   ? ?  ? Lumbar Exercises: Stretches  ? Passive Hamstring Stretch Right;Left;3 reps;10 seconds;20 seconds   ? Single Knee to Chest Stretch Left;Right;3 reps;20 seconds;10 seconds   ? Lower Trunk Rotation 3 reps;10 seconds   ?  ? Lumbar Exercises: Aerobic  ? Nustep L5 x 6 min warm up.   ?  ? Lumbar Exercises: Machines for Strengthening  ? Other Lumbar Machine Exercise Rows & lats 25lb 2x10   ?  ? Lumbar Exercises: Standing  ? Row Strengthening;Power tower;Both;20 reps   ? Row Limitations 15   ? Shoulder Extension Strengthening;Power Tower;Theraband   ? Shoulder Extension Limitations 10   ? Other  Standing Lumbar Exercises Hip Ext 10lb 2x10 each   ?  ? Lumbar Exercises: Supine  ? Bridge Compliant;15 reps;2 seconds   ? ?  ?  ? ?  ? ? ? ? ? ? ? ? ? ? ? ? PT Short Term Goals - 01/26/22 1828   ? ?  ? PT SHORT TERM GOAL #1  ? Title I with initial HEP   ? Time 3   ? Period Weeks   ? Status On-going   ? Target Date 02/17/22   ? ?  ?  ? ?  ? ? ? ? PT Long Term Goals - 02/02/22 1055   ? ?  ? PT LONG TERM GOAL #1  ? Title I with final HEP   ? Time 8   ? Period Weeks   ? Status New   ? Target Date 03/17/22   ?  ? PT LONG TERM GOAL #2  ? Title Paitent will demosntrate lumbar ROM at least 80% in all directions iwth pain < 3/10   ? Time 8   ?  Period Weeks   ? Status New   ? Target Date 03/17/22   ?  ? PT LONG TERM GOAL #3  ? Title Patient will ambulate up and down at least 12 steps with steadying use of handrail, relying fully on LE stregnth, but may use step to gait.   ? Time 8   ? Period Weeks   ? Status New   ? Target Date 03/17/22   ?  ? PT LONG TERM GOAL #4  ? Title Patient will be able to walk x at least 15 minutes with back pain < 3/10   ? Baseline Patient reproted severe pain in low back while walking on the beach this weekend.   ? Time 7   ? Period Weeks   ? Status On-going   ? Target Date 03/17/22   ? ?  ?  ? ?  ? ? ? ? ? ? ? ? Plan - 02/09/22 1223   ? ? Clinical Impression Statement Pt enters clinic admitting that she tends to over do it at times. 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 The increase pain pt was having this past weekend from cleaning has lessened allowing a progression in therapy. Tactile cues for core engagement needed with standing shoulder extensions ad rows. Cue to complete lat pull downs with full ROM. Pt was very tight with lower trunk rotations.   ? Personal Factors and Comorbidities Past/Current Experience;Fitness;Comorbidity 1;Profession   ? Comorbidities chronic multilevel cervical disc degeneration   ? Examination-Activity Limitations  Reach Overhead;Locomotion Level;Transfers;Bend;Caring for Others;Carry;Sleep;Lift;Stand;Stairs   ? Examination-Participation Restrictions Occupation;Cleaning;Community Activity;Driving;Shop;Yard Work;Laundry   ? Stability/Clinical Decision Making Stable/Uncomplicated   ? Rehab Potential Good   ? PT Frequency 2x / week   ? PT Duration 8 weeks   ? PT Treatment/Interventions ADLs/Self Care Home Management;Iontophoresis '4mg'$ /ml Dexamethasone;Gait training;Stair training;Patient/family education;Functional mobility training;Passive range of motion;Dry needling;Manual techniques;Neuromuscular re-education;Balance training;Therapeutic exercise;Therapeutic activities;Moist Heat;Electrical Stimulation   ? PT Next Visit Plan Progress with trunk stability, LE strength.   ? ?  ?  ? ?  ? ? ?Patient will benefit from skilled therapeutic intervention in order to improve the following deficits and impairments:  Abnormal gait, Decreased range of motion, Difficulty walking, Pain, Decreased strength, Decreased mobility, Postural dysfunction, Improper body mechanics, Impaired flexibility ? ?Visit Diagnosis: ?Muscle weakness (generalized) ? ?Chronic right-sided low back pain without sciatica ? ?Difficulty in walking, not elsewhere classified ? ? ? ? ?Problem List ?Patient Active Problem List  ? Diagnosis Date Noted  ? Acute right-sided low back pain with right-sided sciatica 12/22/2021  ? Vitamin D deficiency 12/22/2021  ? Estrogen deficiency 12/22/2021  ? Cervical radiculopathy 11/24/2021  ? Obesity 04/27/2016  ? Pain in the chest 04/27/2016  ? ? ?Scot Jun, PTA ?02/09/2022, 12:30 PM ? ?[place] ?[address] ?[address] ?[place], [place], [address] ?Phone: [phone]   Fax:  [phone] ? ?Name: DALEAH COULSON ?MRN: [id number] ?Date of Birth: 05/29/1961 ? ? ? ?

## 2022-02-11 ENCOUNTER — Encounter: Payer: Self-pay | Admitting: Family

## 2022-02-11 ENCOUNTER — Ambulatory Visit: Payer: BC Managed Care – PPO | Admitting: Family

## 2022-02-11 VITALS — BP 170/80 | HR 61 | Temp 98.4°F | Ht 63.0 in | Wt 228.2 lb

## 2022-02-11 DIAGNOSIS — N950 Postmenopausal bleeding: Secondary | ICD-10-CM | POA: Diagnosis not present

## 2022-02-11 DIAGNOSIS — Z0000 Encounter for general adult medical examination without abnormal findings: Secondary | ICD-10-CM

## 2022-02-11 DIAGNOSIS — Z13220 Encounter for screening for lipoid disorders: Secondary | ICD-10-CM

## 2022-02-11 DIAGNOSIS — M791 Myalgia, unspecified site: Secondary | ICD-10-CM

## 2022-02-11 DIAGNOSIS — R5383 Other fatigue: Secondary | ICD-10-CM

## 2022-02-11 DIAGNOSIS — Z1211 Encounter for screening for malignant neoplasm of colon: Secondary | ICD-10-CM

## 2022-02-11 DIAGNOSIS — R7309 Other abnormal glucose: Secondary | ICD-10-CM

## 2022-02-11 DIAGNOSIS — Z1231 Encounter for screening mammogram for malignant neoplasm of breast: Secondary | ICD-10-CM

## 2022-02-11 DIAGNOSIS — E538 Deficiency of other specified B group vitamins: Secondary | ICD-10-CM

## 2022-02-11 LAB — SEDIMENTATION RATE: Sed Rate: 45 mm/hr — ABNORMAL HIGH (ref 0–30)

## 2022-02-11 LAB — CBC WITH DIFFERENTIAL/PLATELET
Basophils Absolute: 0 10*3/uL (ref 0.0–0.1)
Basophils Relative: 0.9 % (ref 0.0–3.0)
Eosinophils Absolute: 0.1 10*3/uL (ref 0.0–0.7)
Eosinophils Relative: 1.1 % (ref 0.0–5.0)
HCT: 41.1 % (ref 36.0–46.0)
Hemoglobin: 13.7 g/dL (ref 12.0–15.0)
Lymphocytes Relative: 30.4 % (ref 12.0–46.0)
Lymphs Abs: 1.5 10*3/uL (ref 0.7–4.0)
MCHC: 33.3 g/dL (ref 30.0–36.0)
MCV: 85.4 fl (ref 78.0–100.0)
Monocytes Absolute: 0.4 10*3/uL (ref 0.1–1.0)
Monocytes Relative: 8.9 % (ref 3.0–12.0)
Neutro Abs: 2.8 10*3/uL (ref 1.4–7.7)
Neutrophils Relative %: 58.7 % (ref 43.0–77.0)
Platelets: 145 10*3/uL — ABNORMAL LOW (ref 150.0–400.0)
RBC: 4.81 Mil/uL (ref 3.87–5.11)
RDW: 14.1 % (ref 11.5–15.5)
WBC: 4.8 10*3/uL (ref 4.0–10.5)

## 2022-02-11 LAB — COMPREHENSIVE METABOLIC PANEL
ALT: 33 U/L (ref 0–35)
AST: 25 U/L (ref 0–37)
Albumin: 4.6 g/dL (ref 3.5–5.2)
Alkaline Phosphatase: 57 U/L (ref 39–117)
BUN: 19 mg/dL (ref 6–23)
CO2: 28 mEq/L (ref 19–32)
Calcium: 9.8 mg/dL (ref 8.4–10.5)
Chloride: 104 mEq/L (ref 96–112)
Creatinine, Ser: 0.66 mg/dL (ref 0.40–1.20)
GFR: 95 mL/min (ref >60.00)
Glucose, Bld: 100 mg/dL — ABNORMAL HIGH (ref 70–99)
Potassium: 4.4 mEq/L (ref 3.5–5.1)
Sodium: 140 mEq/L (ref 135–145)
Total Bilirubin: 0.8 mg/dL (ref 0.2–1.2)
Total Protein: 6.9 g/dL (ref 6.0–8.3)

## 2022-02-11 LAB — LIPID PANEL
Cholesterol: 217 mg/dL — ABNORMAL HIGH (ref 0–200)
HDL: 54.4 mg/dL (ref >39.00)
LDL Cholesterol: 146 mg/dL — ABNORMAL HIGH (ref 0–99)
NonHDL: 162.66
Total CHOL/HDL Ratio: 4
Triglycerides: 83 mg/dL (ref 0.0–149.0)
VLDL: 16.6 mg/dL (ref 0.0–40.0)

## 2022-02-11 LAB — HEMOGLOBIN A1C: Hgb A1c MFr Bld: 5.7 % (ref 4.6–6.5)

## 2022-02-11 MED ORDER — LISINOPRIL 10 MG PO TABS: 10.0000 mg | ORAL_TABLET | Freq: Every day | ORAL | 3 refills | Status: DC

## 2022-02-11 NOTE — Progress Notes (Signed)
?Natasha [name] is a 61 y.o. female with the following history as recorded in EpicCare:  ?Patient Active Problem List  ? Diagnosis Date Noted  ? Acute right-sided low back pain with right-sided sciatica 12/22/2021  ? Vitamin D deficiency 12/22/2021  ? Estrogen deficiency 12/22/2021  ? Cervical radiculopathy 11/24/2021  ? Obesity 04/27/2016  ? Pain in the chest 04/27/2016  ?  ?Current Outpatient Medications  ?Medication Sig Dispense Refill  ? lisinopril (ZESTRIL) 10 MG tablet Take 1 tablet (10 mg total) by mouth daily. 90 tablet 3  ? ?No current facility-administered medications for this visit.  ?  ?Allergies: Codeine  ?Past Medical History:  ?Diagnosis Date  ? Allergy   ? Anemia   ? Arthritis   ? Back pain   ? Cancer [facility])   ? HTN (hypertension)   ? SOB (shortness of breath) on exertion   ? Spinal stenosis   ? Torn meniscus   ?  ?Past Surgical History:  ?Procedure Laterality Date  ? CESAREAN SECTION    ? KNEE ARTHROSCOPY  12/20/2017  ? left knee  ? TONSILLECTOMY  1984  ?  ?Family History  ?Problem Relation Age of Onset  ? High blood pressure Mother   ? High Cholesterol Mother   ? Heart disease Mother   ? Cancer Father   ? High blood pressure Father   ?  ?Social History  ? ?Tobacco Use  ? Smoking status: Never  ?  Passive exposure: Past  ? Smokeless tobacco: Never  ?Substance Use Topics  ? Alcohol use: Not Currently  ?  ?Subjective:  ?Presents today as a new patient; last seen in primary care over 3 years ago; ?History of hypertension- was able to stop medication with weight loss but has re-gained 30 pounds in the past; would be okay to re-start Lisinopril;  ?Overdue for mammogram/ colonoscopy; ?Does mention that she did have some vaginal spotting in the past few weeks; overdue for pap smear;  ? ?Review of Systems  ?Constitutional: Negative.   ?HENT: Negative.    ?Eyes: Negative.   ?Respiratory: Negative.    ?Cardiovascular: Negative.   ?Gastrointestinal: Negative.   ?Genitourinary: Negative.   ?Musculoskeletal:   Positive for back pain, myalgias and neck pain.  ?Skin: Negative.   ?Neurological: Negative.   ?Endo/Heme/Allergies: Negative.   ?Psychiatric/Behavioral: Negative.    ? ? ? ? ? ?Objective:  ?Vitals:  ? 02/11/22 0959  ?BP: (!) 170/80  ?Pulse: 61  ?Temp: 98.4 ?F (36.9 ?C)  ?TempSrc: Oral  ?SpO2: 98%  ?Weight: 228 lb 3.2 oz (103.5 kg)  ?Height: $RemoveB'5\' 3"'leNFwXel$  (1.6 m)  ?  ?General: Well developed, well nourished, in no acute distress  ?Skin : Warm and dry.  ?Head: Normocephalic and atraumatic  ?Eyes: Sclera and conjunctiva clear; pupils round and reactive to light; extraocular movements intact  ?Ears: External normal; canals clear; tympanic membranes normal  ?Oropharynx: Pink, supple. No suspicious lesions  ?Neck: Supple without thyromegaly, adenopathy  ?Lungs: Respirations unlabored; clear to auscultation bilaterally without wheeze, rales, rhonchi  ?CVS exam: normal rate and regular rhythm.  ?Abdomen: Soft; nontender; nondistended; normoactive bowel sounds; no masses or hepatosplenomegaly  ?Musculoskeletal: No deformities; no active joint inflammation  ?Extremities: No edema, cyanosis, clubbing  ?Vessels: Symmetric bilaterally  ?Neurologic: Alert and oriented; speech intact; face symmetrical; moves all extremities well; CNII-XII intact without focal deficit  ?Assessment:  ?1. PE (physical exam), annual   ?2. Visit for screening mammogram   ?3. Encounter for screening colonoscopy   ?4.  Post-menopausal bleeding   ?5. Lipid screening   ?6. Low serum vitamin B12   ?7. Myalgia   ?8. Other fatigue   ?9. Elevated glucose   ?  ?Plan:  ?Will update fasting labs; order for mammogram, colonoscopy updated;  ?Refer to GI for abnormal bleeding/ pap smear; ?Re-start Lisinopril 10 mg daily; she will start checking her blood pressure regularly;  ? ?This visit occurred during the SARS-CoV-2 public health emergency.  Safety protocols were in place, including screening questions prior to the visit, additional usage of staff PPE, and extensive  cleaning of exam room while observing appropriate contact time as indicated for disinfecting solutions.  ? ? ?Return in about 4 weeks (around 03/11/2022).  ?Orders Placed This Encounter  ?Procedures  ? MM Digital Screening  ?  Order Specific Question:   Reason for Exam (SYMPTOM  OR DIAGNOSIS REQUIRED)  ?  Answer:   screening mammogram  ?  Order Specific Question:   Is the patient pregnant?  ?  Answer:   No  ?  Order Specific Question:   Preferred imaging location?  ?  Answer:   [facility]  ? CBC with Differential/Platelet  ? Comp Met (CMET)  ? Lipid panel  ? TSH  ? B12  ? Antinuclear Antib (ANA)  ? Sedimentation rate  ? Rheumatoid Factor  ? Hemoglobin A1c  ? Ambulatory referral to Gastroenterology  ?  Referral Priority:   Routine  ?  Referral Type:   Consultation  ?  Referral Reason:   Specialty Services Required  ?  Number of Visits Requested:   1  ? Ambulatory referral to Obstetrics / Gynecology  ?  Referral Priority:   Routine  ?  Referral Type:   Consultation  ?  Referral Reason:   Specialty Services Required  ?  Requested Specialty:   Obstetrics and Gynecology  ?  Number of Visits Requested:   1  ?  ?Requested Prescriptions  ? ?Signed Prescriptions Disp Refills  ? lisinopril (ZESTRIL) 10 MG tablet 90 tablet 3  ?  Sig: Take 1 tablet (10 mg total) by mouth daily.  ?  ? ?

## 2022-02-14 ENCOUNTER — Other Ambulatory Visit: Payer: Self-pay

## 2022-02-14 ENCOUNTER — Ambulatory Visit: Payer: BC Managed Care – PPO | Admitting: Physical Therapy

## 2022-02-14 ENCOUNTER — Encounter: Payer: Self-pay | Admitting: Physical Therapy

## 2022-02-14 DIAGNOSIS — M6281 Muscle weakness (generalized): Secondary | ICD-10-CM

## 2022-02-14 DIAGNOSIS — R262 Difficulty in walking, not elsewhere classified: Secondary | ICD-10-CM

## 2022-02-14 DIAGNOSIS — R2681 Unsteadiness on feet: Secondary | ICD-10-CM

## 2022-02-14 DIAGNOSIS — G8929 Other chronic pain: Secondary | ICD-10-CM

## 2022-02-14 LAB — ANTI-NUCLEAR AB-TITER (ANA TITER)
ANA TITER: 1:80 {titer} — ABNORMAL HIGH
ANA Titer 1: 1:80 {titer} — ABNORMAL HIGH

## 2022-02-14 LAB — ANA: Anti Nuclear Antibody (ANA): POSITIVE — AB

## 2022-02-14 LAB — RHEUMATOID FACTOR: Rheumatoid fact SerPl-aCnc: <14 IU/mL (ref <14)

## 2022-02-14 NOTE — Therapy (Signed)
[place] ?[place] [address] ?[place], [place], [address] ?Phone: [phone]   Fax:  [phone] ? ?Physical Therapy Treatment ? ?Patient Details  ?Name: Natasha [name] ?MRN: [id number] ?Date of Birth: 12-07-1960 ?Referring Provider (PT): Raeford Razor ? ? ?Encounter Date: 02/14/2022 ? ? PT End of Session - 02/14/22 1226   ? ? Visit Number 6   ? Date for PT Re-Evaluation 03/17/22   ? PT Start Time 1145   ? PT Stop Time 1230   ? PT Time Calculation (min) 45 min   ? Activity Tolerance Patient tolerated treatment well   ? Behavior During Therapy [facility] for tasks assessed/performed   ? ?  ?  ? ?  ? ? ?Past Medical History:  ?Diagnosis Date  ? Allergy   ? Anemia   ? Arthritis   ? Back pain   ? Cancer [facility])   ? HTN (hypertension)   ? SOB (shortness of breath) on exertion   ? Spinal stenosis   ? Torn meniscus   ? ? ?Past Surgical History:  ?Procedure Laterality Date  ? CESAREAN SECTION    ? KNEE ARTHROSCOPY  12/20/2017  ? left knee  ? TONSILLECTOMY  1984  ? ? ?There were no vitals filed for this visit. ? ? Subjective Assessment - 02/14/22 1148   ? ? Subjective Doing all right, has the normal pain nothing acute   ? Currently in Pain? No/denies   ? ?  ?  ? ?  ? ? ? ? ? ? ? ? ? ? ? ? ? ? ? ? ? ? ? ? [place] Adult PT Treatment/Exercise - 02/14/22 0001   ? ?  ? Lumbar Exercises: Stretches  ? Passive Hamstring Stretch Right;Left;3 reps;10 seconds;20 seconds   ? Single Knee to Chest Stretch Left;Right;3 reps;20 seconds;10 seconds   ? Double Knee to Chest Stretch 2 reps;10 seconds   ? Lower Trunk Rotation 3 reps;10 seconds   ?  ? Lumbar Exercises: Aerobic  ? UBE (Upper Arm Bike) L2 x 2 min each   ? Recumbent Bike L3 x 5 min   ?  ? Lumbar Exercises: Machines for Strengthening  ? Other Lumbar Machine Exercise Rows & lats 25lb 2x12   ?  ? Lumbar Exercises: Standing  ? Row Strengthening;Power tower;Both;20 reps   ? Row Limitations 15   ? Shoulder Extension Strengthening;Power Tower;Theraband   ?  Shoulder Extension Limitations 10   ?  ? Lumbar Exercises: Supine  ? Other Supine Lumbar Exercises LE on Pball bridges, K2C, Oblq x10 each   ? ?  ?  ? ?  ? ? ? ? ? ? ? ? ? ? ? ? PT Short Term Goals - 01/26/22 1828   ? ?  ? PT SHORT TERM GOAL #1  ? Title I with initial HEP   ? Time 3   ? Period Weeks   ? Status On-going   ? Target Date 02/17/22   ? ?  ?  ? ?  ? ? ? ? PT Long Term Goals - 02/14/22 1226   ? ?  ? PT LONG TERM GOAL #1  ? Title I with final HEP   ? Status Partially Met   ?  ? PT LONG TERM GOAL #2  ? Title Paitent will demosntrate lumbar ROM at least 80% in all directions iwth pain < 3/10   ? Status On-going   ?  ? PT LONG TERM GOAL #3  ?  Title Patient will ambulate up and down at least 12 steps with steadying use of handrail, relying fully on LE stregnth, but may use step to gait.   ? Status On-going   ? ?  ?  ? ?  ? ? ? ? ? ? ? ? Plan - 02/14/22 1228   ? ? Clinical Impression Statement Pt enters clinic feeling fine reporting improved mobility since starting therapy. Tactile cue needed with for posture with standing shoulder extensions and rows. Cue to prevent posterior sway with seated rows. Posterior chain weakness noted with supine bridges LE on Pball. Tightness noted with trunk rotations.   ? Personal Factors and Comorbidities Past/Current Experience;Fitness;Comorbidity 1;Profession   ? Comorbidities chronic multilevel cervical disc degeneration   ? Examination-Activity Limitations Reach Overhead;Locomotion Level;Transfers;Bend;Caring for Others;Carry;Sleep;Lift;Stand;Stairs   ? Examination-Participation Restrictions Occupation;Cleaning;Community Activity;Driving;Shop;Yard Work;Laundry   ? Stability/Clinical Decision Making Stable/Uncomplicated   ? Rehab Potential Good   ? PT Frequency 2x / week   ? PT Duration 8 weeks   ? PT Treatment/Interventions ADLs/Self Care Home Management;Iontophoresis 44m/ml Dexamethasone;Gait training;Stair training;Patient/family education;Functional mobility  training;Passive range of motion;Dry needling;Manual techniques;Neuromuscular re-education;Balance training;Therapeutic exercise;Therapeutic activities;Moist Heat;Electrical Stimulation   ? PT Next Visit Plan Progress with trunk stability, LE strength.   ? ?  ?  ? ?  ? ? ?Patient will benefit from skilled therapeutic intervention in order to improve the following deficits and impairments:  Abnormal gait, Decreased range of motion, Difficulty walking, Pain, Decreased strength, Decreased mobility, Postural dysfunction, Improper body mechanics, Impaired flexibility ? ?Visit Diagnosis: ?Muscle weakness (generalized) ? ?Chronic right-sided low back pain without sciatica ? ?Difficulty in walking, not elsewhere classified ? ?Unsteadiness on feet ? ? ? ? ?Problem List ?Patient Active Problem List  ? Diagnosis Date Noted  ? Acute right-sided low back pain with right-sided sciatica 12/22/2021  ? Vitamin D deficiency 12/22/2021  ? Estrogen deficiency 12/22/2021  ? Cervical radiculopathy 11/24/2021  ? Obesity 04/27/2016  ? Pain in the chest 04/27/2016  ? ? ?RScot Jun PTA ?02/14/2022, 12:31 PM ? ?[place] ?[address] ?[place] [address]?Phone: [phone]  Fax:  [phone]? ?Name: LHARMONI LUCUS?MRN: [id number]?Date of Birth: [birth year]? ? ? ?

## 2022-02-15 ENCOUNTER — Encounter: Payer: Self-pay | Admitting: Gastroenterology

## 2022-02-15 LAB — TSH: TSH: 2.93 u[IU]/mL (ref 0.35–5.50)

## 2022-02-15 LAB — VITAMIN B12: Vitamin B-12: 197 pg/mL — ABNORMAL LOW (ref 211–911)

## 2022-02-16 ENCOUNTER — Other Ambulatory Visit: Payer: Self-pay

## 2022-02-16 ENCOUNTER — Ambulatory Visit: Payer: BC Managed Care – PPO | Admitting: Physical Therapy

## 2022-02-16 ENCOUNTER — Telehealth (HOSPITAL_BASED_OUTPATIENT_CLINIC_OR_DEPARTMENT_OTHER): Payer: Self-pay

## 2022-02-16 ENCOUNTER — Encounter: Payer: Self-pay | Admitting: Physical Therapy

## 2022-02-16 DIAGNOSIS — M6281 Muscle weakness (generalized): Secondary | ICD-10-CM

## 2022-02-16 DIAGNOSIS — R262 Difficulty in walking, not elsewhere classified: Secondary | ICD-10-CM

## 2022-02-16 DIAGNOSIS — G8929 Other chronic pain: Secondary | ICD-10-CM

## 2022-02-16 NOTE — Therapy (Signed)
[place] ?[place] ?[address] ?[place], [place], [address] ?Phone: [phone]   Fax:  [phone] ? ?Physical Therapy Treatment ? ?Patient Details  ?Name: Natasha [name] ?MRN: [id number] ?Date of Birth: Feb 11, 1961 ?Referring Provider (PT): Raeford Razor ? ? ?Encounter Date: 02/16/2022 ? ? PT End of Session - 02/16/22 1010   ? ? Visit Number 7   ? Date for PT Re-Evaluation 03/17/22   ? PT Start Time 0930   ? PT Stop Time 1014   ? PT Time Calculation (min) 44 min   ? Activity Tolerance Patient tolerated treatment well   ? Behavior During Therapy [facility] for tasks assessed/performed   ? ?  ?  ? ?  ? ? ?Past Medical History:  ?Diagnosis Date  ? Allergy   ? Anemia   ? Arthritis   ? Back pain   ? Cancer [facility])   ? HTN (hypertension)   ? SOB (shortness of breath) on exertion   ? Spinal stenosis   ? Torn meniscus   ? ? ?Past Surgical History:  ?Procedure Laterality Date  ? CESAREAN SECTION    ? KNEE ARTHROSCOPY  12/20/2017  ? left knee  ? TONSILLECTOMY  1984  ? ? ?There were no vitals filed for this visit. ? ? Subjective Assessment - 02/16/22 0934   ? ? Subjective "Im good"   ? Currently in Pain? Yes   ? Pain Score 1    ? Pain Location Back   ? ?  ?  ? ?  ? ? ? ? ? ? ? ? ? ? ? ? ? ? ? ? ? ? ? ? [place] Adult PT Treatment/Exercise - 02/16/22 0001   ? ?  ? Lumbar Exercises: Stretches  ? Single Knee to Chest Stretch Left;Right;3 reps;20 seconds;10 seconds   ? Double Knee to Chest Stretch 4 reps;20 seconds   ? Lower Trunk Rotation 3 reps;10 seconds   ?  ? Lumbar Exercises: Aerobic  ? Nustep L5 x 6 min warm up.   ?  ? Lumbar Exercises: Machines for Strengthening  ? Cybex Knee Extension 10lb 2x10   ? Cybex Knee Flexion 35lb 2x10   ? Other Lumbar Machine Exercise Rows & lats 35lb 2x10   ?  ? Lumbar Exercises: Standing  ? Shoulder Extension Strengthening;Power Tower;Theraband   ? Shoulder Extension Limitations 10   ? Other Standing Lumbar Exercises Step ips 4in box on airex x10 each   ? Other Standing  Lumbar Exercises AR press 15lb x10 each, Overhead Ext yellow 2x10   ?  ? Lumbar Exercises: Supine  ? Bridge Compliant;10 reps;2 seconds   ? ?  ?  ? ?  ? ? ? ? ? ? ? ? ? ? ? ? PT Short Term Goals - 01/26/22 1828   ? ?  ? PT SHORT TERM GOAL #1  ? Title I with initial HEP   ? Time 3   ? Period Weeks   ? Status On-going   ? Target Date 02/17/22   ? ?  ?  ? ?  ? ? ? ? PT Long Term Goals - 02/14/22 1226   ? ?  ? PT LONG TERM GOAL #1  ? Title I with final HEP   ? Status Partially Met   ?  ? PT LONG TERM GOAL #2  ? Title Paitent will demosntrate lumbar ROM at least 80% in all directions iwth pain < 3/10   ? Status On-going   ?  ?  PT LONG TERM GOAL #3  ? Title Patient will ambulate up and down at least 12 steps with steadying use of handrail, relying fully on LE stregnth, but may use step to gait.   ? Status On-going   ? ?  ?  ? ?  ? ? ? ? ? ? ? ? Plan - 02/16/22 1013   ? ? Clinical Impression Statement Pt enters clinic reporting less pain overall. No issues completing today's interventions. Postural cue required with standing shoulder extensions. Added seated LE curls sn extensions without issues. Pt continues to demo posterior chain weakness with supine bridging. No reports of increase pain during today session.   ? Personal Factors and Comorbidities Past/Current Experience;Fitness;Comorbidity 1;Profession   ? Comorbidities chronic multilevel cervical disc degeneration   ? Examination-Activity Limitations Reach Overhead;Locomotion Level;Transfers;Bend;Caring for Others;Carry;Sleep;Lift;Stand;Stairs   ? Examination-Participation Restrictions Occupation;Cleaning;Community Activity;Driving;Shop;Yard Work;Laundry   ? Stability/Clinical Decision Making Stable/Uncomplicated   ? Rehab Potential Good   ? PT Frequency 2x / week   ? PT Duration 8 weeks   ? PT Treatment/Interventions ADLs/Self Care Home Management;Iontophoresis 15m/ml Dexamethasone;Gait training;Stair training;Patient/family education;Functional mobility  training;Passive range of motion;Dry needling;Manual techniques;Neuromuscular re-education;Balance training;Therapeutic exercise;Therapeutic activities;Moist Heat;Electrical Stimulation   ? PT Next Visit Plan Progress with trunk stability, LE strength.   ? ?  ?  ? ?  ? ? ?Patient will benefit from skilled therapeutic intervention in order to improve the following deficits and impairments:  Abnormal gait, Decreased range of motion, Difficulty walking, Pain, Decreased strength, Decreased mobility, Postural dysfunction, Improper body mechanics, Impaired flexibility ? ?Visit Diagnosis: ?Muscle weakness (generalized) ? ?Difficulty in walking, not elsewhere classified ? ?Chronic right-sided low back pain without sciatica ? ? ? ? ?Problem List ?Patient Active Problem List  ? Diagnosis Date Noted  ? Acute right-sided low back pain with right-sided sciatica 12/22/2021  ? Vitamin D deficiency 12/22/2021  ? Estrogen deficiency 12/22/2021  ? Cervical radiculopathy 11/24/2021  ? Obesity 04/27/2016  ? Pain in the chest 04/27/2016  ? ? ?RScot Jun PTA ?02/16/2022, 11:01 AM ? ?[place] ?[address] ?[place] [address]?Phone: [phone]  Fax:  [phone]? ?Name: LTERIYAH PURINGTON?MRN: [id number]?Date of Birth: 4May 09, 1962? ? ? ?

## 2022-02-21 ENCOUNTER — Encounter: Payer: Self-pay | Admitting: Physical Therapy

## 2022-02-21 ENCOUNTER — Ambulatory Visit (HOSPITAL_BASED_OUTPATIENT_CLINIC_OR_DEPARTMENT_OTHER)
Admission: RE | Admit: 2022-02-21 | Discharge: 2022-02-21 | Disposition: A | Discharge status: Home / Self Care | Payer: BC Managed Care – PPO | Source: Ambulatory Visit | Attending: Family | Admitting: Family

## 2022-02-21 ENCOUNTER — Encounter (HOSPITAL_BASED_OUTPATIENT_CLINIC_OR_DEPARTMENT_OTHER): Payer: Self-pay

## 2022-02-21 ENCOUNTER — Ambulatory Visit: Payer: BC Managed Care – PPO | Attending: Family Medicine | Admitting: Physical Therapy

## 2022-02-21 DIAGNOSIS — R262 Difficulty in walking, not elsewhere classified: Secondary | ICD-10-CM | POA: Insufficient documentation

## 2022-02-21 DIAGNOSIS — M6281 Muscle weakness (generalized): Secondary | ICD-10-CM | POA: Diagnosis present

## 2022-02-21 DIAGNOSIS — G8929 Other chronic pain: Secondary | ICD-10-CM | POA: Diagnosis present

## 2022-02-21 DIAGNOSIS — Z1231 Encounter for screening mammogram for malignant neoplasm of breast: Secondary | ICD-10-CM | POA: Diagnosis present

## 2022-02-21 DIAGNOSIS — M545 Low back pain, unspecified: Secondary | ICD-10-CM | POA: Insufficient documentation

## 2022-02-21 DIAGNOSIS — R2681 Unsteadiness on feet: Secondary | ICD-10-CM | POA: Insufficient documentation

## 2022-02-21 NOTE — Therapy (Signed)
[place] ?[place] ?[address] ?[place], [place], [address] ?Phone: [phone]   Fax:  [phone] ? ?Physical Therapy Treatment ? ?Patient Details  ?Name: Natasha [name] ?MRN: [id number] ?Date of Birth: [birth year] ?Referring Provider (PT): Raeford Razor ? ? ?Encounter Date: 02/21/2022 ? ? PT End of Session - 02/21/22 1155   ? ? Visit Number 8   ? Date for PT Re-Evaluation 03/17/22   ? PT Start Time 1145   ? PT Stop Time 1007   ? PT Time Calculation (min) 39 min   ? Activity Tolerance Patient tolerated treatment well   ? Behavior During Therapy [facility] for tasks assessed/performed   ? ?  ?  ? ?  ? ? ?Past Medical History:  ?Diagnosis Date  ? Allergy   ? Anemia   ? Arthritis   ? Back pain   ? Cancer [facility])   ? HTN (hypertension)   ? SOB (shortness of breath) on exertion   ? Spinal stenosis   ? Torn meniscus   ? ? ?Past Surgical History:  ?Procedure Laterality Date  ? CESAREAN SECTION    ? KNEE ARTHROSCOPY  12/20/2017  ? left knee  ? TONSILLECTOMY  1984  ? ? ?There were no vitals filed for this visit. ? ? Subjective Assessment - 02/21/22 1146   ? ? Subjective Patient reports that her back is feeling better. She had a flare up previously after performing a lot of physical work at home, but it has subsided.   ? Pertinent History 1. Multilevel cervical disc degeneration, worst at C5-6 where there  is moderate to severe spinal stenosis and moderate to severe right  neural foraminal stenosis.  2. Moderate spinal stenosis at C4-5.  3. Moderate neural foraminal stenosis bilaterally at C6-7 and on the  right at C7-T1.   ? Currently in Pain? No/denies   ? ?  ?  ? ?  ? ? ? ? ? ? ? ? ? ? ? ? ? ? ? ? ? ? ? ? [place] Adult PT Treatment/Exercise - 02/21/22 0001   ? ?  ? Lumbar Exercises: Stretches  ? Other Lumbar Stretch Exercise Hip flexor stretch-supine on edge of mat, each side x 30 seconds   ?  ? Lumbar Exercises: Aerobic  ? UBE (Upper Arm Bike) L1.5, 3 min forward/3 min back   ?  ? Lumbar  Exercises: Machines for Strengthening  ? Other Lumbar Machine Exercise Rows & lats 35lb 2x10   ? Other Lumbar Machine Exercise Standing shoulder ext with pulleys-20# x 10 reps, 15# x 10 reps   ?  ? Lumbar Exercises: Standing  ? Other Standing Lumbar Exercises Paloff press 15#, 2 x 10 reps to each side.   ?  ? Lumbar Exercises: Supine  ? Bridge Compliant;10 reps;2 seconds   ? Large Ball Abdominal Isometric 10 reps;2 seconds   ? Large Ball Oblique Isometric 10 reps;2 seconds   ? Large Ball Oblique Isometric Limitations to each side   ? Other Supine Lumbar Exercises Roll B K2C while holding bridge- 2 x 5 reps.   ? Other Supine Lumbar Exercises Roll ball side to side while holding a bridge, 10 reps.   ?  ? Lumbar Exercises: Sidelying  ? Hip Abduction Both;10 reps;2 seconds   ? Other Sidelying Lumbar Exercises Sidelie hip abd, then hold hip abd with slow movement of hip forward and back x 10 reps, stabilizing through trunk to remain in sidelie.   ? ?  ?  ? ?  ? ? ? ? ? ? ? ? ? ? ? ?  PT Short Term Goals - 01/26/22 1828   ? ?  ? PT SHORT TERM GOAL #1  ? Title I with initial HEP   ? Time 3   ? Period Weeks   ? Status On-going   ? Target Date 02/17/22   ? ?  ?  ? ?  ? ? ? ? PT Long Term Goals - 02/14/22 1226   ? ?  ? PT LONG TERM GOAL #1  ? Title I with final HEP   ? Status Partially Met   ?  ? PT LONG TERM GOAL #2  ? Title Paitent will demosntrate lumbar ROM at least 80% in all directions iwth pain < 3/10   ? Status On-going   ?  ? PT LONG TERM GOAL #3  ? Title Patient will ambulate up and down at least 12 steps with steadying use of handrail, relying fully on LE stregnth, but may use step to gait.   ? Status On-going   ? ?  ?  ? ?  ? ? ? ? ? ? ? ? Plan - 02/21/22 1150   ? ? Clinical Impression Statement Patient reports back pain is better overall. She is trying to perform HEP, it is more difficult to exercise at home. Treatment progressed with strenthening exercises, emphasizing posterior chain and lateral hip stability,  with good participation.   ? Stability/Clinical Decision Making Stable/Uncomplicated   ? Clinical Decision Making Moderate   ? Rehab Potential Good   ? PT Frequency 2x / week   ? PT Duration 8 weeks   ? PT Treatment/Interventions ADLs/Self Care Home Management;Iontophoresis 4mg /ml Dexamethasone;Gait training;Stair training;Patient/family education;Functional mobility training;Passive range of motion;Dry needling;Manual techniques;Neuromuscular re-education;Balance training;Therapeutic exercise;Therapeutic activities;Moist Heat;Electrical Stimulation   ? PT Next Visit Plan 4/5- update HEP.   ? PT Home Exercise Plan [email or web address]   ? Consulted and Agree with Plan of Care Patient   ? ?  ?  ? ?  ? ? ?Patient will benefit from skilled therapeutic intervention in order to improve the following deficits and impairments:  Abnormal gait, Decreased range of motion, Difficulty walking, Pain, Decreased strength, Decreased mobility, Postural dysfunction, Improper body mechanics, Impaired flexibility ? ?Visit Diagnosis: ?Muscle weakness (generalized) ? ?Difficulty in walking, not elsewhere classified ? ?Chronic right-sided low back pain without sciatica ? ?Unsteadiness on feet ? ? ? ? ?Problem List ?Patient Active Problem List  ? Diagnosis Date Noted  ? Acute right-sided low back pain with right-sided sciatica 12/22/2021  ? Vitamin D deficiency 12/22/2021  ? Estrogen deficiency 12/22/2021  ? Cervical radiculopathy 11/24/2021  ? Obesity 04/27/2016  ? Pain in the chest 04/27/2016  ? ? ?Marcelina Morel, DPT ?02/21/2022, 12:25 PM ? ?[place] ?[place] ?[address] ?[place], [place], [address] ?Phone: [phone]   Fax:  [phone] ? ?Name: Natasha [name] ?MRN: [id number] ?Date of Birth: 1961-09-24 ? ? ? ?

## 2022-02-23 ENCOUNTER — Encounter: Payer: Self-pay | Admitting: Physical Therapy

## 2022-02-23 ENCOUNTER — Ambulatory Visit: Payer: BC Managed Care – PPO | Admitting: Physical Therapy

## 2022-02-23 DIAGNOSIS — R2681 Unsteadiness on feet: Secondary | ICD-10-CM

## 2022-02-23 DIAGNOSIS — M6281 Muscle weakness (generalized): Secondary | ICD-10-CM

## 2022-02-23 DIAGNOSIS — R262 Difficulty in walking, not elsewhere classified: Secondary | ICD-10-CM

## 2022-02-23 DIAGNOSIS — M545 Low back pain, unspecified: Secondary | ICD-10-CM

## 2022-02-23 NOTE — Patient Instructions (Signed)
Access Code: [email or web address] ?URL: [email or web address] ?Date: 02/23/2022 ?Prepared by: Ethel Rana ? ?Program Notes ?All stretches, 3 x 15 second holds, all strength, try 2 x 10 reps, but only if you can hold good form throughout. ? ?Exercises ?- Supine Hamstring Stretch with Strap  - 1 x daily - 7 x weekly - 10 reps ?- Supine ITB Stretch with Strap  - 1 x daily - 7 x weekly - 3 sets - 10 reps ?- Hip Adductors and Hamstring Stretch with Strap  - 1 x daily - 7 x weekly - 3 sets - 10 reps ?- Supine Double Knee to Chest  - 1 x daily - 7 x weekly - 3 sets - 10 reps ?- Supine Figure 4 Piriformis Stretch  - 1 x daily - 7 x weekly - 3 sets - 10 reps ?- Supine Bridge with Resistance Band  - 1 x daily - 7 x weekly - 3 sets - 10 reps ?- Supine March  - 1 x daily - 7 x weekly - 3 sets - 10 reps ?- Standing Heel Raise with Support  - 1 x daily - 7 x weekly - 3 sets - 10 reps ?- Standing Hip Abduction  - 1 x daily - 7 x weekly - 3 sets - 10 reps ?- Step Up  - 1 x daily - 7 x weekly - 2 sets - 10 reps ?- Lateral Step Up  - 1 x daily - 7 x weekly - 2 sets - 10 reps ?- Crossover Step Up  - 1 x daily - 7 x weekly - 2 sets - 10 reps ?- Shoulder Extension with Resistance  - 1 x daily - 7 x weekly - 2 sets - 10 reps ?- Standing Bilateral Low Shoulder Row with Anchored Resistance  - 1 x daily - 7 x weekly - 2 sets - 10 reps ?- Shoulder External Rotation and Scapular Retraction with Resistance  - 1 x daily - 7 x weekly - 2 sets - 10 reps ?- Bird Dog  - 1 x daily - 7 x weekly - 2 sets - 10 reps ?- Quadruped Cat Cow  - 1 x daily - 7 x weekly - 1 sets - 10 reps ?

## 2022-02-23 NOTE — Therapy (Signed)
[place] ?[place] ?[address] ?[place], [place], [address] ?Phone: [phone]   Fax:  [phone] ? ?Physical Therapy Treatment ? ?Patient Details  ?Name: Natasha [name] ?MRN: [id number] ?Date of Birth: 30-Dec-1960 ?Referring Provider (PT): Raeford Razor ? ? ?Encounter Date: 02/23/2022 ? ? PT End of Session - 02/23/22 1311   ? ? Visit Number 9   ? Date for PT Re-Evaluation 03/17/22   ? PT Start Time 1232   ? PT Stop Time 1310   ? PT Time Calculation (min) 38 min   ? Activity Tolerance Patient tolerated treatment well   ? Behavior During Therapy [facility] for tasks assessed/performed   ? ?  ?  ? ?  ? ? ?Past Medical History:  ?Diagnosis Date  ? Allergy   ? Anemia   ? Arthritis   ? Back pain   ? Cancer [facility])   ? HTN (hypertension)   ? SOB (shortness of breath) on exertion   ? Spinal stenosis   ? Torn meniscus   ? ? ?Past Surgical History:  ?Procedure Laterality Date  ? CESAREAN SECTION    ? KNEE ARTHROSCOPY  12/20/2017  ? left knee  ? TONSILLECTOMY  1984  ? ? ?There were no vitals filed for this visit. ? ? Subjective Assessment - 02/23/22 1232   ? ? Subjective Patient reports that things appear to be moving in the right direction.   ? Pertinent History 1. Multilevel cervical disc degeneration, worst at C5-6 where there  is moderate to severe spinal stenosis and moderate to severe right  neural foraminal stenosis.  2. Moderate spinal stenosis at C4-5.  3. Moderate neural foraminal stenosis bilaterally at C6-7 and on the  right at C7-T1.   ? Currently in Pain? No/denies   ? ?  ?  ? ?  ? ? ? ? ? ? ? ? ? ? ? ? ? ? ? ? ? ? ? ? [place] Adult PT Treatment/Exercise - 02/23/22 0001   ? ?  ? Lumbar Exercises: Stretches  ? Other Lumbar Stretch Exercise Hip flexor stretch-supine on edge of mat, each side x 30 seconds   ? Other Lumbar Stretch Exercise Quadruped cat/cow   ?  ? Lumbar Exercises: Standing  ? Row Strengthening;Both;20 reps;Theraband   ? Theraband Level (Row) Level 3 (Green)   ? Shoulder  Extension Strengthening;Both;20 reps;Theraband   ? Theraband Level (Shoulder Extension) Level 3 (Green)   ? Shoulder Extension Limitations Also performed shoulder ER, 20 reps, Green Tband   ? Other Standing Lumbar Exercises step ups onto 6" box. 10 each leg, forward, side step, crossover.   ?  ? Lumbar Exercises: Quadruped  ? Single Arm Raise Right;Left;5 reps   ? Straight Leg Raise 5 reps;3 seconds   ? Opposite Arm/Leg Raise Right arm/Left leg;Left arm/Right leg;5 reps;3 seconds   ? ?  ?  ? ?  ? ? ? ? ? ? ? ? ? ? PT Education - 02/23/22 1310   ? ? Education Details Updated HEP.   ? Person(s) Educated Patient   ? Methods Explanation;Demonstration;Handout   ? Comprehension Returned demonstration;Verbalized understanding   ? ?  ?  ? ?  ? ? ? PT Short Term Goals - 01/26/22 1828   ? ?  ? PT SHORT TERM GOAL #1  ? Title I with initial HEP   ? Time 3   ? Period Weeks   ? Status On-going   ? Target Date 02/17/22   ? ?  ?  ? ?  ? ? ? ?  PT Long Term Goals - 02/23/22 1248   ? ?  ? PT LONG TERM GOAL #1  ? Title I with final HEP   ? Time 5   ? Status On-going   ?  ? PT LONG TERM GOAL #2  ? Title Paitent will demosntrate lumbar ROM at least 80% in all directions iwth pain < 3/10   ? Time 5   ? Status On-going   ?  ? PT LONG TERM GOAL #3  ? Title Patient will ambulate up and down at least 12 steps with steadying use of handrail, relying fully on LE stregnth, but may use step to gait.   ? Time 5   ? Status On-going   ?  ? PT LONG TERM GOAL #4  ? Title Patient will be able to walk x at least 15 minutes with back pain < 3/10   ? Time 5   ? Status On-going   ? ?  ?  ? ?  ? ? ? ? ? ? ? ? Plan - 02/23/22 1249   ? ? Clinical Impression Statement Patient feels she continues to move in the right direction with her back pain. Feels her HEP is not addressing enough strengthening, so updated to include multiple lower body and trunk stability exercises. Patient performed all with good effort.   ? Personal Factors and Comorbidities Past/Current  Experience;Fitness;Comorbidity 1;Profession   ? Comorbidities chronic multilevel cervical disc degeneration   ? Examination-Activity Limitations Reach Overhead;Locomotion Level;Transfers;Bend;Caring for Others;Carry;Sleep;Lift;Stand;Stairs   ? Examination-Participation Restrictions Occupation;Cleaning;Community Activity;Driving;Shop;Yard Work;Laundry   ? Stability/Clinical Decision Making Stable/Uncomplicated   ? Clinical Decision Making Moderate   ? Rehab Potential Good   ? PT Frequency 2x / week   ? PT Duration 2 weeks   ? PT Treatment/Interventions ADLs/Self Care Home Management;Iontophoresis '4mg'$ /ml Dexamethasone;Gait training;Stair training;Patient/family education;Functional mobility training;Passive range of motion;Dry needling;Manual techniques;Neuromuscular re-education;Balance training;Therapeutic exercise;Therapeutic activities;Moist Heat;Electrical Stimulation   ? PT Next Visit Plan Progressive strengthening.   ? PT Home Exercise Plan [email or web address]   ? Consulted and Agree with Plan of Care Patient   ? ?  ?  ? ?  ? ? ?Patient will benefit from skilled therapeutic intervention in order to improve the following deficits and impairments:  Abnormal gait, Decreased range of motion, Difficulty walking, Pain, Decreased strength, Decreased mobility, Postural dysfunction, Improper body mechanics, Impaired flexibility ? ?Visit Diagnosis: ?Muscle weakness (generalized) ? ?Difficulty in walking, not elsewhere classified ? ?Chronic right-sided low back pain without sciatica ? ?Unsteadiness on feet ? ? ? ? ?Problem List ?Patient Active Problem List  ? Diagnosis Date Noted  ? Acute right-sided low back pain with right-sided sciatica 12/22/2021  ? Vitamin D deficiency 12/22/2021  ? Estrogen deficiency 12/22/2021  ? Cervical radiculopathy 11/24/2021  ? Obesity 04/27/2016  ? Pain in the chest 04/27/2016  ? ? ?Marcelina Morel, DPT ?02/23/2022, 1:13 PM ? ?[place] ?[place] ?[address] ?[place], [place], [address] ?Phone: [phone]   Fax:  [phone] ? ?Name: LEYNA VANDERKOLK ?MRN: [id number] ?Date of Birth: June 06, 1961 ? ? ? ?

## 2022-02-28 ENCOUNTER — Encounter: Payer: Self-pay | Admitting: Physical Therapy

## 2022-02-28 ENCOUNTER — Ambulatory Visit: Payer: BC Managed Care – PPO | Admitting: Physical Therapy

## 2022-02-28 DIAGNOSIS — M6281 Muscle weakness (generalized): Secondary | ICD-10-CM

## 2022-02-28 DIAGNOSIS — R2681 Unsteadiness on feet: Secondary | ICD-10-CM

## 2022-02-28 DIAGNOSIS — R262 Difficulty in walking, not elsewhere classified: Secondary | ICD-10-CM

## 2022-02-28 DIAGNOSIS — G8929 Other chronic pain: Secondary | ICD-10-CM

## 2022-02-28 NOTE — Therapy (Signed)
[facility] [facility] ?[place], [place], [address] ?Phone: [phone]   Fax:  [phone] ? ?Physical Therapy Treatment ?Progress Note ?Reporting Period 01/20/22 to 02/28/22 ? ?See note below for Objective Data and Assessment of Progress/Goals.  ? ?  ?Patient Details  ?Name: Natasha [name] ?MRN: [id number] ?Date of Birth: January 20, 1961 ?Referring Provider (PT): Raeford Razor ? ? ?Encounter Date: 02/28/2022 ? ? PT End of Session - 02/28/22 1046   ? ? Visit Number 10   ? Date for PT Re-Evaluation 03/17/22   ? PT Start Time 1016   ? PT Stop Time 1055   ? PT Time Calculation (min) 39 min   ? Activity Tolerance Patient tolerated treatment well   ? Behavior During Therapy [facility] for tasks assessed/performed   ? ?  ?  ? ?  ? ? ?Past Medical History:  ?Diagnosis Date  ? Allergy   ? Anemia   ? Arthritis   ? Back pain   ? Cancer [facility])   ? HTN (hypertension)   ? SOB (shortness of breath) on exertion   ? Spinal stenosis   ? Torn meniscus   ? ? ?Past Surgical History:  ?Procedure Laterality Date  ? CESAREAN SECTION    ? KNEE ARTHROSCOPY  12/20/2017  ? left knee  ? TONSILLECTOMY  1984  ? ? ?There were no vitals filed for this visit. ? ? Subjective Assessment - 02/28/22 1017   ? ? Subjective Patient reports no problems. She reports her knees bothered her more than her back this weekend.   ? Pertinent History 1. Multilevel cervical disc degeneration, worst at C5-6 where there  is moderate to severe spinal stenosis and moderate to severe right  neural foraminal stenosis.  2. Moderate spinal stenosis at C4-5.  3. Moderate neural foraminal stenosis bilaterally at C6-7 and on the  right at C7-T1.   ? How long can you sit comfortably? As long as she has back support she is ok.   ? How long can you stand comfortably? Tolerates less than 5 mnutes.   ? How long can you walk comfortably? Tolerates less than 5 minutes of walking before requiring a break due to pain.   ? Patient Stated Goals Be able to  move and keep up with family activities without back pain.   ? Currently in Pain? No/denies   ? Pain Onset More than a month ago   ? ?  ?  ? ?  ? ? ? ? ? [facility] PT Assessment - 02/28/22 0001   ? ?  ? AROM  ? AROM Assessment Site Lumbar   ? Lumbar Flexion 80%   ? Lumbar Extension 80   ? Lumbar - Right Side Bend 80   ? Lumbar - Left Side Bend 80   ? Lumbar - Right Rotation 80   ? Lumbar - Left Rotation 80   ? ?  ?  ? ?  ? ? ? ? ? ? ? ? ? ? ? ? ? ? ? ? [facility] Adult PT Treatment/Exercise - 02/28/22 0001   ? ?  ? Lumbar Exercises: Aerobic  ? Nustep L5 x 6 min warm up.   ?  ? Lumbar Exercises: Machines for Strengthening  ? Other Lumbar Machine Exercise Standing shoulder ext with pulleys-20# x 10 reps, 15# x 10 reps   ?  ? Lumbar Exercises: Seated  ? Other Seated Lumbar Exercises Ant/Post tilt on physiodisc, then moved to standing at wall  to reproduce PPT, Occasional TC and VC for form.   ? ?  ?  ? ?  ? ? ? ? ? ? ? ? ? ? ? ? PT Short Term Goals - 02/28/22 1020   ? ?  ? PT SHORT TERM GOAL #1  ? Title I with initial HEP   ? Time 3   ? Period Weeks   ? Status Achieved   ? Target Date 02/17/22   ? ?  ?  ? ?  ? ? ? ? PT Long Term Goals - 02/28/22 1021   ? ?  ? PT LONG TERM GOAL #1  ? Title I with final HEP   ? Time 2   ? Period Weeks   ? Status On-going   ? Target Date 03/17/22   ?  ? PT LONG TERM GOAL #2  ? Title Paitent will demosntrate lumbar ROM at least 80% in all directions iwth pain < 3/10   ? Time 5   ? Status Achieved   ?  ? PT LONG TERM GOAL #3  ? Title Patient will ambulate up and down at least 12 steps with steadying use of handrail, relying fully on LE stregnth, but may use step to gait.   ? Time 5   ? Status Achieved   ?  ? PT LONG TERM GOAL #4  ? Title Patient will be able to walk x at least 15 minutes with back pain < 3/10   ? Time 2   ? Period Weeks   ? Status On-going   ? Target Date 03/17/22   ? ?  ?  ? ?  ? ? ? ? ? ? ? ? Plan - 02/28/22 1022   ? ? Clinical Impression Statement Patient reports no issues with her  back. Her knees bohtered her more over the weekend, which involved a lot of stair climbing. functional status re-assessed for PN. Treatment focused on posture during gait, inlcuding trunk stabilizatoin and stretch to ant chest. Patient is progressing toward all LTG   ? Personal Factors and Comorbidities Past/Current Experience;Fitness;Comorbidity 1;Profession   ? Comorbidities chronic multilevel cervical disc degeneration   ? Examination-Activity Limitations Reach Overhead;Locomotion Level;Transfers;Bend;Caring for Others;Carry;Sleep;Lift;Stand;Stairs   ? Examination-Participation Restrictions Occupation;Cleaning;Community Activity;Driving;Shop;Yard Work;Laundry   ? Stability/Clinical Decision Making Stable/Uncomplicated   ? Clinical Decision Making Low   ? Rehab Potential Good   ? PT Frequency 2x / week   ? PT Duration 2 weeks   ? PT Treatment/Interventions ADLs/Self Care Home Management;Iontophoresis '4mg'$ /ml Dexamethasone;Gait training;Stair training;Patient/family education;Functional mobility training;Passive range of motion;Dry needling;Manual techniques;Neuromuscular re-education;Balance training;Therapeutic exercise;Therapeutic activities;Moist Heat;Electrical Stimulation   ? PT Next Visit Plan Progressive strengthening. Possibly Epley for R ear.   ? [place], [phone]   ? Consulted and Agree with Plan of Care Patient   ? ?  ?  ? ?  ? ? ?Patient will benefit from skilled therapeutic intervention in order to improve the following deficits and impairments:  Abnormal gait, Decreased range of motion, Difficulty walking, Pain, Decreased strength, Decreased mobility, Postural dysfunction, Improper body mechanics, Impaired flexibility ? ?Visit Diagnosis: ?Muscle weakness (generalized) ? ?Difficulty in walking, not elsewhere classified ? ?Chronic right-sided low back pain without sciatica ? ?Unsteadiness on feet ? ? ? ? ?Problem List ?Patient Active Problem List  ? Diagnosis Date Noted  ? Acute  right-sided low back pain with right-sided sciatica 12/22/2021  ? Vitamin D deficiency 12/22/2021  ? Estrogen deficiency 12/22/2021  ? Cervical radiculopathy  11/24/2021  ? Obesity 04/27/2016  ? Pain in the chest 04/27/2016  ? ? ?Marcelina Morel, DPT ?02/28/2022, 10:59 AM ? ?[place] ?[address] ?[place], [place], [address] ?Phone: [phone]   Fax:  [phone] ? ?Name: MELI FALEY ?MRN: [id number] ?Date of Birth: 1961/08/24 ? ? ? ?

## 2022-02-28 NOTE — Patient Instructions (Signed)
Access Code: [email or web address] ?URL: [email or web address] ?Date: 02/28/2022 ?Prepared by: Ethel Rana ? ?Exercises ?- Doorway Pec Stretch at 60 Elevation  - 1 x daily - 7 x weekly - 3 reps - 10 hold ?- Doorway Pec Stretch at 90 Degrees Abduction  - 1 x daily - 7 x weekly - 3 reps - 10 hold ?- Doorway Pec Stretch at 120 Degrees Abduction  - 1 x daily - 7 x weekly - 3 reps - 10 hold ?

## 2022-03-02 ENCOUNTER — Encounter: Payer: Self-pay | Admitting: Physical Therapy

## 2022-03-02 ENCOUNTER — Ambulatory Visit: Payer: BC Managed Care – PPO | Admitting: Physical Therapy

## 2022-03-02 DIAGNOSIS — M6281 Muscle weakness (generalized): Secondary | ICD-10-CM

## 2022-03-02 DIAGNOSIS — G8929 Other chronic pain: Secondary | ICD-10-CM

## 2022-03-02 DIAGNOSIS — R262 Difficulty in walking, not elsewhere classified: Secondary | ICD-10-CM

## 2022-03-02 DIAGNOSIS — R2681 Unsteadiness on feet: Secondary | ICD-10-CM

## 2022-03-02 NOTE — Therapy (Signed)
[facility] ?[address] ?[place], [place], [address] ?Phone: [phone]   Fax:  [phone] ? ?Physical Therapy Treatment ? ?Patient Details  ?Name: Natasha [name] ?MRN: [id number] ?Date of Birth: 19-Nov-1961 ?Referring Provider (PT): Raeford Razor ? ? ?Encounter Date: 03/02/2022 ? ? PT End of Session - 03/02/22 1007   ? ? Visit Number 11   ? Date for PT Re-Evaluation 03/17/22   ? PT Start Time 0930   ? PT Stop Time 1010   ? PT Time Calculation (min) 40 min   ? Activity Tolerance Patient tolerated treatment well   ? Behavior During Therapy [facility] for tasks assessed/performed   ? ?  ?  ? ?  ? ? ?Past Medical History:  ?Diagnosis Date  ? Allergy   ? Anemia   ? Arthritis   ? Back pain   ? Cancer [facility])   ? HTN (hypertension)   ? SOB (shortness of breath) on exertion   ? Spinal stenosis   ? Torn meniscus   ? ? ?Past Surgical History:  ?Procedure Laterality Date  ? CESAREAN SECTION    ? KNEE ARTHROSCOPY  12/20/2017  ? left knee  ? TONSILLECTOMY  1984  ? ? ?There were no vitals filed for this visit. ? ? Subjective Assessment - 03/02/22 0936   ? ? Subjective Patient stated she felt normal, had no problems.   ? Currently in Pain? No/denies   ? ?  ?  ? ?  ? ? ? ? ? ? ? ? ? ? ? ? ? ? ? ? ? ? ? ? [place] Adult PT Treatment/Exercise - 03/02/22 0001   ? ?  ? Lumbar Exercises: Aerobic  ? Recumbent Bike L 3.7 x 6 mins   ?  ? Lumbar Exercises: Machines for Strengthening  ? Cybex Knee Extension 10lb 2x10   ? Cybex Knee Flexion 35lb 2x10   ? Other Lumbar Machine Exercise AR press 2x10 20#; rows and lats 2x15 35#,   ? Other Lumbar Machine Exercise Standing shoulder ext with pulleys-10# 2x15   ?  ? Lumbar Exercises: Standing  ? Other Standing Lumbar Exercises overhead reach 2x10 w/ yellow weight ball   ?  ? Lumbar Exercises: Seated  ? Other Seated Lumbar Exercises STS w/ overhead press w/ yellow weight ball 2x10.   ? ?  ?  ? ?  ? ? ? ? ? ? ? ? ? ? ? ? PT Short Term Goals - 02/28/22 1020   ? ?  ? PT  SHORT TERM GOAL #1  ? Title I with initial HEP   ? Time 3   ? Period Weeks   ? Status Achieved   ? Target Date 02/17/22   ? ?  ?  ? ?  ? ? ? ? PT Long Term Goals - 03/02/22 1008   ? ?  ? PT LONG TERM GOAL #1  ? Title I with final HEP   ? Status Achieved   ?  ? PT LONG TERM GOAL #2  ? Title Paitent will demosntrate lumbar ROM at least 80% in all directions iwth pain < 3/10   ? Status Achieved   ?  ? PT LONG TERM GOAL #3  ? Title Patient will ambulate up and down at least 12 steps with steadying use of handrail, relying fully on LE stregnth, but may use step to gait.   ? Status Achieved   ?  ? PT LONG TERM GOAL #  4  ? Title Patient will be able to walk x at least 15 minutes with back pain < 3/10   ? Status Achieved   ? ?  ?  ? ?  ? ? ? ? ? ? ? ? Plan - 03/02/22 0958   ? ? Clinical Impression Statement Patient reports no problems with her back. Focused on strengthening core/trunk. VC's needed for correct posture on shoulder extension. Pt reports no functional limitations and home and is pleased with her current functional status.   ? Personal Factors and Comorbidities Past/Current Experience;Fitness;Comorbidity 1;Profession   ? Comorbidities chronic multilevel cervical disc degeneration   ? Examination-Activity Limitations Reach Overhead;Locomotion Level;Transfers;Bend;Caring for Others;Carry;Sleep;Lift;Stand;Stairs   ? Examination-Participation Restrictions Occupation;Cleaning;Community Activity;Driving;Shop;Yard Work;Laundry   ? Stability/Clinical Decision Making Stable/Uncomplicated   ? Clinical Decision Making Low   ? Rehab Potential Good   ? PT Frequency 2x / week   ? PT Duration 2 weeks   ? PT Treatment/Interventions ADLs/Self Care Home Management;Iontophoresis 90m/ml Dexamethasone;Gait training;Stair training;Patient/family education;Functional mobility training;Passive range of motion;Dry needling;Manual techniques;Neuromuscular re-education;Balance training;Therapeutic exercise;Therapeutic activities;Moist  Heat;Electrical Stimulation   ? PT Next Visit Plan Progressive strengthening. Possibly Epley for R ear.   ? [place] [phone]  ? Consulted and Agree with Plan of Care Patient   ? ?  ?  ? ?  ? ? ?Patient will benefit from skilled therapeutic intervention in order to improve the following deficits and impairments:  Abnormal gait, Decreased range of motion, Difficulty walking, Pain, Decreased strength, Decreased mobility, Postural dysfunction, Improper body mechanics, Impaired flexibility ? ?Visit Diagnosis: ?Muscle weakness (generalized) ? ?Difficulty in walking, not elsewhere classified ? ?Chronic right-sided low back pain without sciatica ? ?Unsteadiness on feet ? ? ? ? ?Problem List ?Patient Active Problem List  ? Diagnosis Date Noted  ? Acute right-sided low back pain with right-sided sciatica 12/22/2021  ? Vitamin D deficiency 12/22/2021  ? Estrogen deficiency 12/22/2021  ? Cervical radiculopathy 11/24/2021  ? Obesity 04/27/2016  ? Pain in the chest 04/27/2016  ? ?PHYSICAL THERAPY DISCHARGE SUMMARY ? ?Visits from Start of Care: 11 ? ? ?Patient agrees to discharge. Patient goals were met. Patient is being discharged due to meeting the stated rehab goals. ? ? ?Natasha [name] ?03/02/2022, 10:10 AM ? ?[place] ?[address] ?[place] [place] [address]?Phone: [phone]  Fax:  [phone]? ?Name: Natasha HubbardMRN: [id number]?Date of Birth: [birth year]? ? ? ?

## 2022-03-04 ENCOUNTER — Encounter: Payer: Self-pay | Admitting: General Practice

## 2022-03-04 ENCOUNTER — Ambulatory Visit (AMBULATORY_SURGERY_CENTER): Payer: BC Managed Care – PPO | Admitting: *Deleted

## 2022-03-04 VITALS — Ht 63.0 in | Wt 220.0 lb

## 2022-03-04 DIAGNOSIS — Z1211 Encounter for screening for malignant neoplasm of colon: Secondary | ICD-10-CM

## 2022-03-04 MED ORDER — NA SULFATE-K SULFATE-MG SULF 17.5-3.13-1.6 GM/177ML PO SOLN: 2.0000 | Freq: Once | ORAL | 0 refills | Status: AC

## 2022-03-04 NOTE — Progress Notes (Signed)
No egg or soy allergy known to patient  ?No issues known to pt with past sedation with any surgeries or procedures ?Patient denies ever being told they had issues or difficulty with intubation  ?No FH of Malignant Hyperthermia ?Pt is not on diet pills ?Pt is not on  home 02  ?Pt is not on blood thinners  ?Pt denies issues with constipation  ?No A fib or A flutter ? ? ?PV completed over the phone. Pt verified name, DOB, address and insurance during PV today.  ? ?Pt encouraged to call with questions or issues.  ?If pt has My chart, procedure instructions sent via My Chart   ?

## 2022-03-08 ENCOUNTER — Ambulatory Visit: Payer: BC Managed Care – PPO | Admitting: Family

## 2022-03-16 ENCOUNTER — Encounter: Payer: Self-pay | Admitting: Gastroenterology

## 2022-03-17 ENCOUNTER — Ambulatory Visit: Payer: BC Managed Care – PPO | Admitting: Family

## 2022-03-17 ENCOUNTER — Encounter: Payer: Self-pay | Admitting: Family

## 2022-03-17 VITALS — BP 148/76 | HR 68 | Temp 97.5°F | Ht 63.0 in | Wt 227.0 lb

## 2022-03-17 DIAGNOSIS — I10 Essential (primary) hypertension: Secondary | ICD-10-CM | POA: Diagnosis not present

## 2022-03-17 DIAGNOSIS — E538 Deficiency of other specified B group vitamins: Secondary | ICD-10-CM | POA: Diagnosis not present

## 2022-03-17 DIAGNOSIS — E785 Hyperlipidemia, unspecified: Secondary | ICD-10-CM

## 2022-03-17 MED ORDER — LISINOPRIL 20 MG PO TABS: 20.0000 mg | ORAL_TABLET | Freq: Every day | ORAL | 3 refills | Status: AC

## 2022-03-17 NOTE — Progress Notes (Signed)
?Natasha [name] is a 61 y.o. female with the following history as recorded in EpicCare:  ?Patient Active Problem List  ? Diagnosis Date Noted  ? Acute right-sided low back pain with right-sided sciatica 12/22/2021  ? Vitamin D deficiency 12/22/2021  ? Estrogen deficiency 12/22/2021  ? Cervical radiculopathy 11/24/2021  ? Obesity 04/27/2016  ? Pain in the chest 04/27/2016  ?  ?Current Outpatient Medications  ?Medication Sig Dispense Refill  ? lisinopril (ZESTRIL) 20 MG tablet Take 1 tablet (20 mg total) by mouth daily. 90 tablet 3  ? ?No current facility-administered medications for this visit.  ?  ?Allergies: Codeine  ?Past Medical History:  ?Diagnosis Date  ? Allergy   ? Anemia   ? Arthritis   ? Back pain   ? Cancer [facility])   ? HTN (hypertension)   ? SOB (shortness of breath) on exertion   ? Spinal stenosis   ? Torn meniscus   ?  ?Past Surgical History:  ?Procedure Laterality Date  ? CESAREAN SECTION    ? KNEE ARTHROSCOPY  12/20/2017  ? left knee, right knee 2021  ? TONSILLECTOMY  1984  ?  ?Family History  ?Problem Relation Age of Onset  ? High blood pressure Mother   ? High Cholesterol Mother   ? Heart disease Mother   ? Colon cancer Father   ? Cancer Father   ? High blood pressure Father   ? Colon polyps Neg Hx   ? Esophageal cancer Neg Hx   ? Stomach cancer Neg Hx   ? Rectal cancer Neg Hx   ?  ?Social History  ? ?Tobacco Use  ? Smoking status: Never  ?  Passive exposure: Past  ? Smokeless tobacco: Never  ?Substance Use Topics  ? Alcohol use: Not Currently  ?  ?Subjective:  ?Follow up on hypertension/ review and discuss labs done at last OV; ?Denies any chest pain, shortness of breath, blurred vision or headache ?Will be going to rheumatology in the next week;  ? ? ? ?The 10-year ASCVD risk score (Arnett DK, et al., 2019) is: 7% ?  Values used to calculate the score: ?    Age: 74 years ?    Sex: Female ?    Is Non-Hispanic African American: No ?    Diabetic: No ?    Tobacco smoker: No ?    Systolic Blood Pressure:  148 mmHg ?    Is BP treated: Yes ?    HDL Cholesterol: 54.4 mg/dL ?    Total Cholesterol: 217 mg/dL ? ? ? ? ?'@HCCSF'$ @  ?Objective:  ?Vitals:  ? 03/17/22 0848 03/17/22 3149  ?BP: (!) 152/80 (!) 148/76  ?Pulse: 68   ?Temp: (!) 97.5 ?F (36.4 ?C)   ?TempSrc: Oral   ?SpO2: 99%   ?Weight: 227 lb (103 kg)   ?Height: '5\' 3"'$  (1.6 m)   ?  ?General: Well developed, well nourished, in no acute distress  ?Skin : Warm and dry.  ?Head: Normocephalic and atraumatic  ?Lungs: Respirations unlabored; clear to auscultation bilaterally without wheeze, rales, rhonchi  ?CVS exam: normal rate and regular rhythm.  ?Neurologic: Alert and oriented; speech intact; face symmetrical; moves all extremities well; CNII-XII intact without focal deficit  ? ?Assessment:  ?1. Primary hypertension   ?2. Hyperlipidemia, unspecified hyperlipidemia type   ?3. Low serum vitamin B12   ?  ?Plan:  ?Increase to Lisinopril 20 mg daily; re-check in 3 months; ?Will hold statin for now until rheumatology consult is complete- re-evaluate  in 3 months; ?Patient is on oral supplement; she defers updating today and is planning to get re-checked with rheumatology at upcoming appointment ? ?Return in about 3 months (around 06/16/2022).  ?No orders of the defined types were placed in this encounter. ?  ?Requested Prescriptions  ? ?Signed Prescriptions Disp Refills  ? lisinopril (ZESTRIL) 20 MG tablet 90 tablet 3  ?  Sig: Take 1 tablet (20 mg total) by mouth daily.  ?  ? ?

## 2022-03-20 ENCOUNTER — Encounter: Payer: Self-pay | Admitting: Certified Registered Nurse Anesthetist

## 2022-03-25 ENCOUNTER — Encounter: Payer: Self-pay | Admitting: Gastroenterology

## 2022-03-25 ENCOUNTER — Ambulatory Visit (AMBULATORY_SURGERY_CENTER): Payer: BC Managed Care – PPO | Admitting: Gastroenterology

## 2022-03-25 VITALS — BP 127/74 | HR 65 | Temp 97.5°F | Resp 13 | Ht 63.0 in | Wt 220.0 lb

## 2022-03-25 DIAGNOSIS — Z1211 Encounter for screening for malignant neoplasm of colon: Secondary | ICD-10-CM | POA: Diagnosis present

## 2022-03-25 DIAGNOSIS — Z800 Family history of malignant neoplasm of digestive organs: Secondary | ICD-10-CM | POA: Diagnosis not present

## 2022-03-25 DIAGNOSIS — D125 Benign neoplasm of sigmoid colon: Secondary | ICD-10-CM

## 2022-03-25 DIAGNOSIS — K640 First degree hemorrhoids: Secondary | ICD-10-CM

## 2022-03-25 DIAGNOSIS — K635 Polyp of colon: Secondary | ICD-10-CM | POA: Diagnosis not present

## 2022-03-25 MED ORDER — SODIUM CHLORIDE 0.9 % IV SOLN: 500.0000 mL | Freq: Once | INTRAVENOUS | Status: DC

## 2022-03-25 NOTE — Patient Instructions (Signed)
Handout on polyps, hemorrhoid, hemorrhoid banding diverticulosis provided  ? ?Await pathology results.  ? ?Continue current medications.  ? ?YOU HAD AN ENDOSCOPIC PROCEDURE TODAY AT [facility]:   Refer to the procedure report that was given to you for any specific questions about what was found during the examination.  If the procedure report does not answer your questions, please call your gastroenterologist to clarify.  If you requested that your care partner not be given the details of your procedure findings, then the procedure report has been included in a sealed envelope for you to review at your convenience later. ? ?YOU SHOULD EXPECT: Some feelings of bloating in the abdomen. Passage of more gas than usual.  Walking can help get rid of the air that was put into your GI tract during the procedure and reduce the bloating. If you had a lower endoscopy (such as a colonoscopy or flexible sigmoidoscopy) you may notice spotting of blood in your stool or on the toilet paper. If you underwent a bowel prep for your procedure, you may not have a normal bowel movement for a few days. ? ?Please Note:  You might notice some irritation and congestion in your nose or some drainage.  This is from the oxygen used during your procedure.  There is no need for concern and it should clear up in a day or so. ? ?SYMPTOMS TO REPORT IMMEDIATELY: ? ?Following lower endoscopy (colonoscopy or flexible sigmoidoscopy): ? Excessive amounts of blood in the stool ? Significant tenderness or worsening of abdominal pains ? Swelling of the abdomen that is new, acute ? Fever of 100?F or higher ? ?For urgent or emergent issues, a gastroenterologist can be reached at any hour by calling [phone]. ?Do not use MyChart messaging for urgent concerns.  ? ? ?DIET:  We do recommend a small meal at first, but then you may proceed to your regular diet.  Drink plenty of fluids but you should avoid alcoholic beverages for 24  hours. ? ?ACTIVITY:  You should plan to take it easy for the rest of today and you should NOT DRIVE or use heavy machinery until tomorrow (because of the sedation medicines used during the test).   ? ?FOLLOW UP: ?Our staff will call the number listed on your records 48-72 hours following your procedure to check on you and address any questions or concerns that you may have regarding the information given to you following your procedure. If we do not reach you, we will leave a message.  We will attempt to reach you two times.  During this call, we will ask if you have developed any symptoms of COVID 19. If you develop any symptoms (ie: fever, flu-like symptoms, shortness of breath, cough etc.) before then, please call [phone].  If you test positive for Covid 19 in the 2 weeks post procedure, please call and report this information to [place].   ? ?If any biopsies were taken you will be contacted by phone or by letter within the next 1-3 weeks.  Please call us at [phone] if you have not heard about the biopsies in 3 weeks.  ? ? ?SIGNATURES/CONFIDENTIALITY: ?You and/or your care partner have signed paperwork which will be entered into your electronic medical record.  These signatures attest to the fact that that the information above on your After Visit Summary has been reviewed and is understood.  Full responsibility of the confidentiality of this discharge information lies with you and/or your care-partner. ? ? ? ?

## 2022-03-25 NOTE — Progress Notes (Signed)
Report given to PACU, vss 

## 2022-03-25 NOTE — Progress Notes (Signed)
? ?GASTROENTEROLOGY PROCEDURE H&P NOTE  ? ?Primary Care Physician: ?Marrian Salvage, FNP ? ? ? ?Reason for Procedure:  Colon Cancer screening ? ?Plan:    Colonoscopy ? ?Patient is appropriate for endoscopic procedure(s) in the ambulatory ([place]) setting. ? ?The nature of the procedure, as well as the risks, benefits, and alternatives were carefully and thoroughly reviewed with the patient. Ample time for discussion and questions allowed. The patient understood, was satisfied, and agreed to proceed.  ? ? ? ?HPI: ?Natasha [name] is a 61 y.o. female who presents for colonoscopy for routine Colon Cancer screening.  No active GI symptoms.   ? ?Fhx n/f father with colon cancer.  ? ?Past Medical History:  ?Diagnosis Date  ? Allergy   ? Anemia   ? Arthritis   ? Back pain   ? Cancer [facility])   ? HTN (hypertension)   ? SOB (shortness of breath) on exertion   ? Spinal stenosis   ? Torn meniscus   ? ? ?Past Surgical History:  ?Procedure Laterality Date  ? CESAREAN SECTION    ? KNEE ARTHROSCOPY  12/20/2017  ? left knee, right knee 2021  ? TONSILLECTOMY  1984  ? ? ?Prior to Admission medications   ?Medication Sig Start Date End Date Taking? Authorizing Provider  ?lisinopril (ZESTRIL) 20 MG tablet Take 1 tablet (20 mg total) by mouth daily. 03/17/22  Yes Marrian Salvage, FNP  ? ? ?Current Outpatient Medications  ?Medication Sig Dispense Refill  ? lisinopril (ZESTRIL) 20 MG tablet Take 1 tablet (20 mg total) by mouth daily. 90 tablet 3  ? ?Current Facility-Administered Medications  ?Medication Dose Route Frequency Provider Last Rate Last Admin  ? 0.9 %  sodium chloride infusion  500 mL Intravenous Once Shakeda Pearse V, DO      ? ? ?Allergies as of 03/25/2022 - Review Complete 03/25/2022  ?Allergen Reaction Noted  ? Codeine Nausea Only 10/02/2013  ? ? ?Family History  ?Problem Relation Age of Onset  ? High blood pressure Mother   ? High Cholesterol Mother   ? Heart disease Mother   ? Colon cancer Father   ? Cancer Father    ? High blood pressure Father   ? Colon polyps Neg Hx   ? Esophageal cancer Neg Hx   ? Stomach cancer Neg Hx   ? Rectal cancer Neg Hx   ? ? ?Social History  ? ?Socioeconomic History  ? Marital status: Married  ?  Spouse name: Natasha [name]  ? Number of children: Not on file  ? Years of education: Not on file  ? Highest education level: Not on file  ?Occupational History  ? Occupation: Equities trader  ?Tobacco Use  ? Smoking status: Never  ?  Passive exposure: Past  ? Smokeless tobacco: Never  ?Vaping Use  ? Vaping Use: Never used  ?Substance and Sexual Activity  ? Alcohol use: Not Currently  ? Drug use: No  ? Sexual activity: Not on file  ?Other Topics Concern  ? Not on file  ?Social History Narrative  ? Not on file  ? ?Social Determinants of Health  ? ?Financial Resource Strain: Not on file  ?Food Insecurity: Not on file  ?Transportation Needs: Not on file  ?Physical Activity: Not on file  ?Stress: Not on file  ?Social Connections: Not on file  ?Intimate Partner Violence: Not on file  ? ? ?Physical Exam: ?Vital signs in last 24 hours: ?'@BP'$  (!) 158/78   Pulse 72  Temp (!) 97.5 ?F (36.4 ?C)   Ht '5\' 3"'$  (1.6 m)   Wt 220 lb (99.8 kg)   SpO2 99%   BMI 38.97 kg/m?  ?GEN: NAD ?EYE: Sclerae anicteric ?ENT: MMM ?CV: Non-tachycardic ?Pulm: CTA b/l ?GI: Soft, NT/ND ?NEURO:  Alert & Oriented x 3 ? ? ?Gerrit Heck, DO ?[facility] ? ? ?03/25/2022 7:44 AM ? ?

## 2022-03-25 NOTE — Progress Notes (Signed)
Pt's states no medical or surgical changes since previsit or office visit. 

## 2022-03-25 NOTE — Op Note (Signed)
[place] ?Patient Name: Natasha [name] ?Procedure Date: 03/25/2022 7:13 AM ?MRN: [id number] ?Endoscopist: Gerrit Heck , MD ?Age: 61 ?Referring MD:  ?Date of Birth: 11-02-1961 ?Gender: Female ?Account #: [id number] ?Procedure:                Colonoscopy ?Indications:              Screening in patient at increased risk: Colorectal  ?                          cancer in father 65 or older, This is the patient's  ?                          first colonoscopy ?Medicines:                Monitored Anesthesia Care ?Procedure:                Pre-Anesthesia Assessment: ?                          - Prior to the procedure, a History and Physical  ?                          was performed, and patient medications and  ?                          allergies were reviewed. The patient's tolerance of  ?                          previous anesthesia was also reviewed. The risks  ?                          and benefits of the procedure and the sedation  ?                          options and risks were discussed with the patient.  ?                          All questions were answered, and informed consent  ?                          was obtained. Prior Anticoagulants: The patient has  ?                          taken no previous anticoagulant or antiplatelet  ?                          agents. ASA Grade Assessment: II - A patient with  ?                          mild systemic disease. After reviewing the risks  ?                          and benefits, the patient was deemed in  ?  satisfactory condition to undergo the procedure. ?                          After obtaining informed consent, the colonoscope  ?                          was passed under direct vision. Throughout the  ?                          procedure, the patient's blood pressure, pulse, and  ?                          oxygen saturations were monitored continuously. The  ?                          CF [id number] #[id number] was introduced through  the anus  ?                          and advanced to the the cecum, identified by the  ?                          appendiceal orifice, ileocecal valve and palpation.  ?                          The colonoscopy was performed without difficulty.  ?                          The patient tolerated the procedure well. The  ?                          quality of the bowel preparation was good. The  ?                          ileocecal valve, appendiceal orifice, and rectum  ?                          were photographed. ?Scope In: 7:59:28 AM ?Scope Out: 8:10:41 AM ?Scope Withdrawal Time: 0 hours 9 minutes 27 seconds  ?Total Procedure Duration: 0 hours 11 minutes 13 seconds  ?Findings:                 The perianal and digital rectal examinations were  ?                          normal. ?                          A 3 mm polyp was found in the sigmoid colon. The  ?                          polyp was sessile. The polyp was removed with a  ?                          cold snare. Resection and retrieval were complete.  ?  Estimated blood loss was minimal. ?                          The exam was otherwise normal throughout the  ?                          remainder of the colon. ?                          Non-bleeding internal hemorrhoids were found during  ?                          retroflexion. The hemorrhoids were small. ?Complications:            No immediate complications. ?Estimated Blood Loss:     Estimated blood loss was minimal. ?Impression:               - One 3 mm polyp in the sigmoid colon, removed with  ?                          a cold snare. Resected and retrieved. ?                          - Non-bleeding internal hemorrhoids. ?Recommendation:           - Patient has a contact number available for  ?                          emergencies. The signs and symptoms of potential  ?                          delayed complications were discussed with the  ?                          patient. Return to  normal activities tomorrow.  ?                          Written discharge instructions were provided to the  ?                          patient. ?                          - Resume previous diet. ?                          - Continue present medications. ?                          - Await pathology results. ?                          - Repeat colonoscopy in 5-10 years for surveillance  ?                          based on pathology results. ?                          -  Return to GI clinic PRN. ?                          - Internal hemorrhoids were noted on this study and  ?                          may be amenable to hemorrhoid band ligation. If you  ?                          are interested in further treatment of these  ?                          hemorrhoids with band ligation, please contact my  ?                          clinic to set up an appointment for evaluation and  ?                          treatment. ?Gerrit Heck, MD ?03/25/2022 8:15:29 AM ?

## 2022-03-25 NOTE — Progress Notes (Signed)
Called to room to assist during endoscopic procedure.  Patient ID and intended procedure confirmed with present staff. Received instructions for my participation in the procedure from the performing physician.  

## 2022-03-29 ENCOUNTER — Telehealth: Payer: Self-pay

## 2022-03-29 NOTE — Telephone Encounter (Signed)
?  Follow up Call- ? ? ?  03/25/2022  ?  7:25 AM  ?Call back number  ?Post procedure Call Back phone  # [phone]  ?Permission to leave phone message Yes  ?  ? ?Patient questions: ? ?Do you have a fever, pain , or abdominal swelling? No. ?Pain Score  0 * ? ?Have you tolerated food without any problems? Yes.   ? ?Have you been able to return to your normal activities? Yes.   ? ?Do you have any questions about your discharge instructions: ?Diet   No. ?Medications  No. ?Follow up visit  No. ? ?Do you have questions or concerns about your Care? No. ? ?Actions: ?* If pain score is 4 or above: ?No action needed, pain <4. ? ? ?

## 2022-03-30 ENCOUNTER — Encounter: Payer: Self-pay | Admitting: Gastroenterology

## 2022-04-05 ENCOUNTER — Other Ambulatory Visit: Payer: Self-pay | Admitting: Internal Medicine

## 2022-04-05 DIAGNOSIS — R29818 Other symptoms and signs involving the nervous system: Secondary | ICD-10-CM

## 2022-04-19 ENCOUNTER — Ambulatory Visit
Admission: RE | Admit: 2022-04-19 | Discharge: 2022-04-19 | Disposition: A | Discharge status: Home / Self Care | Payer: BC Managed Care – PPO | Source: Ambulatory Visit | Attending: Internal Medicine | Admitting: Internal Medicine

## 2022-04-19 DIAGNOSIS — R29818 Other symptoms and signs involving the nervous system: Secondary | ICD-10-CM

## 2022-05-12 ENCOUNTER — Encounter: Payer: BC Managed Care – PPO | Admitting: Family Medicine

## 2022-06-14 ENCOUNTER — Ambulatory Visit: Payer: BC Managed Care – PPO | Admitting: Family

## 2022-06-14 VITALS — BP 124/74 | HR 52 | Temp 97.7°F | Resp 16 | Ht 63.0 in | Wt 224.0 lb

## 2022-06-14 DIAGNOSIS — E785 Hyperlipidemia, unspecified: Secondary | ICD-10-CM | POA: Diagnosis not present

## 2022-06-14 DIAGNOSIS — M5441 Lumbago with sciatica, right side: Secondary | ICD-10-CM

## 2022-06-14 DIAGNOSIS — I10 Essential (primary) hypertension: Secondary | ICD-10-CM

## 2022-06-14 DIAGNOSIS — Z6839 Body mass index (BMI) 39.0-39.9, adult: Secondary | ICD-10-CM | POA: Diagnosis not present

## 2022-06-14 MED ORDER — WEGOVY 0.25 MG/0.5ML ~~LOC~~ SOAJ
0.2500 mg | SUBCUTANEOUS | 0 refills | Status: DC
Start: 2022-06-14 — End: 2022-07-04

## 2022-06-14 NOTE — Progress Notes (Signed)
Natasha [name] is a 61 y.o. female with the following history as recorded in [employer]:  Patient Active Problem List   Diagnosis Date Noted   Acute right-sided low back pain with right-sided sciatica 12/22/2021   Vitamin D deficiency 12/22/2021   Estrogen deficiency 12/22/2021   Cervical radiculopathy 11/24/2021   Obesity 04/27/2016   Pain in the chest 04/27/2016    Current Outpatient Medications  Medication Sig Dispense Refill   lisinopril (ZESTRIL) 20 MG tablet Take 1 tablet (20 mg total) by mouth daily. 90 tablet 3   Semaglutide-Weight Management (WEGOVY) 0.25 MG/0.5ML SOAJ Inject 0.25 mg into the skin once a week. 2 mL 0   No current facility-administered medications for this visit.    Allergies: Codeine  Past Medical History:  Diagnosis Date   Allergy    Anemia    Arthritis    Back pain    Cancer (HCC)    HTN (hypertension)    SOB (shortness of breath) on exertion    Spinal stenosis    Torn meniscus     Past Surgical History:  Procedure Laterality Date   CESAREAN SECTION     KNEE ARTHROSCOPY  12/20/2017   left knee, right knee 2021   TONSILLECTOMY  1984    Family History  Problem Relation Age of Onset   High blood pressure Mother    High Cholesterol Mother    Heart disease Mother    Colon cancer Father    Cancer Father    High blood pressure Father    Colon polyps Neg Hx    Esophageal cancer Neg Hx    Stomach cancer Neg Hx    Rectal cancer Neg Hx     Social History   Tobacco Use   Smoking status: Never    Passive exposure: Past   Smokeless tobacco: Never  Substance Use Topics   Alcohol use: Not Currently    Subjective:   3 month follow up on chronic care needs; since last here, has completed rheumatology evaluation; no autoimmune process but extensive OA; rheumatologist recommended trial of weight loss medication to help with joints; patient would like to discuss; Has also been changed to sublingulal B12 formulation to help with absorption;       Objective:  Vitals:   06/14/22 0845  BP: 124/74  Pulse: (!) 52  Resp: 16  Temp: 97.7 F (36.5 C)  TempSrc: Oral  SpO2: 98%  Weight: 224 lb (101.6 kg)  Height: '5\' 3"'$  (1.6 m)    General: Well developed, well nourished, in no acute distress  Skin : Warm and dry.  Head: Normocephalic and atraumatic  Lungs: Respirations unlabored; clear to auscultation bilaterally without wheeze, rales, rhonchi  CVS exam: normal rate and regular rhythm.  Neurologic: Alert and oriented; speech intact; face symmetrical; moves all extremities well; CNII-XII intact without focal deficit   Assessment:  1. Primary hypertension   2. Acute right-sided low back pain with right-sided sciatica   3. BMI 39.0-39.9,adult   4. Elevated lipids     Plan:  Stable; refill current; Follow up with specialist to discuss possible ablation; Trial of Wegovy- risks/ benefits discussed; follow up in 3 months; Will hold statin for now- will discuss once patient is stable on Wegovy;     Return in about 3 months (around 09/14/2022).  No orders of the defined types were placed in this encounter.   Requested Prescriptions   Signed Prescriptions Disp Refills   Semaglutide-Weight Management (WEGOVY) 0.25 MG/0.5ML SOAJ 2  mL 0    Sig: Inject 0.25 mg into the skin once a week.

## 2022-07-04 ENCOUNTER — Encounter: Payer: Self-pay | Admitting: Family

## 2022-07-04 ENCOUNTER — Other Ambulatory Visit (HOSPITAL_BASED_OUTPATIENT_CLINIC_OR_DEPARTMENT_OTHER): Payer: Self-pay

## 2022-07-04 ENCOUNTER — Other Ambulatory Visit: Payer: Self-pay | Admitting: Family

## 2022-07-04 MED ORDER — WEGOVY 0.25 MG/0.5ML ~~LOC~~ SOAJ
0.2500 mg | SUBCUTANEOUS | 0 refills | Status: AC
  Filled 2022-07-04: qty 2, 28d supply, fill #0

## 2022-07-14 ENCOUNTER — Encounter: Payer: Self-pay | Admitting: Family Medicine

## 2022-07-14 ENCOUNTER — Ambulatory Visit (INDEPENDENT_AMBULATORY_CARE_PROVIDER_SITE_OTHER): Payer: BC Managed Care – PPO | Admitting: Family Medicine

## 2022-07-14 ENCOUNTER — Other Ambulatory Visit (HOSPITAL_COMMUNITY)
Admission: RE | Admit: 2022-07-14 | Discharge: 2022-07-14 | Disposition: A | Discharge status: Home / Self Care | Payer: BC Managed Care – PPO | Source: Ambulatory Visit | Attending: Family Medicine | Admitting: Family Medicine

## 2022-07-14 VITALS — BP 148/65 | HR 65 | Ht 63.0 in | Wt 223.0 lb

## 2022-07-14 DIAGNOSIS — Z01419 Encounter for gynecological examination (general) (routine) without abnormal findings: Secondary | ICD-10-CM | POA: Diagnosis present

## 2022-07-14 NOTE — Progress Notes (Signed)
GYNECOLOGY ANNUAL PREVENTATIVE CARE ENCOUNTER NOTE  Subjective:   Natasha [name] is a 61 y.o. G31P2002 female here for a routine annual gynecologic exam.  Current complaints: occasional hot flash. Otherwise no concerns. Denies abnormal vaginal bleeding, discharge, pelvic pain, problems with intercourse or other gynecologic concerns.    Has unicornuate uterus. H/o cesarean section. No other surgeries.  Gynecologic History No LMP recorded. Patient is postmenopausal. Last Pap: 2000. Results were: normal Last mammogram: 2023. Results were: birads 1   The pregnancy intention screening data noted above was reviewed. Potential methods of contraception were discussed. The patient elected to proceed with No data recorded.   Obstetric History OB History  Gravida Para Term Preterm AB Living  '2 2 2     2  '$ SAB IAB Ectopic Multiple Live Births          2    # Outcome Date GA Lbr Len/2nd Weight Sex Delivery Anes PTL Lv  2 Term 1998 [redacted]w[redacted]d  M Vag-Spont EPI N LIV  1 Term [id number] M CS-LTranv EPI N LIV    Past Medical History:  Diagnosis Date   Allergy    Anemia    Arthritis    Back pain    Cancer (HCC)    HTN (hypertension)    SOB (shortness of breath) on exertion    Spinal stenosis    Torn meniscus     Past Surgical History:  Procedure Laterality Date   CESAREAN SECTION     KNEE ARTHROSCOPY  12/20/2017   left knee, right knee 2021   TONSILLECTOMY  1984    Current Outpatient Medications on File Prior to Visit  Medication Sig Dispense Refill   lisinopril (ZESTRIL) 20 MG tablet Take 1 tablet (20 mg total) by mouth daily. 90 tablet 3   Semaglutide-Weight Management (WEGOVY) 0.25 MG/0.5ML SOAJ Inject 0.25 mg into the skin once a week. (Patient not taking: Reported on 07/14/2022) 2 mL 0   No current facility-administered medications on file prior to visit.    Allergies  Allergen Reactions   Codeine Nausea Only    Social History   Socioeconomic History   Marital  status: Married    Spouse name: JiJamyria Ozanich Number of children: Not on file   Years of education: Not on file   Highest education level: Not on file  Occupational History   Occupation: Registered Nurse  Tobacco Use   Smoking status: Never    Passive exposure: Past   Smokeless tobacco: Never  Vaping Use   Vaping Use: Never used  Substance and Sexual Activity   Alcohol use: Not Currently   Drug use: No   Sexual activity: Yes    Birth control/protection: Post-menopausal  Other Topics Concern   Not on file  Social History Narrative   Not on file   Social Determinants of Health   Financial Resource Strain: Not on file  Food Insecurity: Not on file  Transportation Needs: Not on file  Physical Activity: Not on file  Stress: Not on file  Social Connections: Not on file  Intimate Partner Violence: Not on file    Family History  Problem Relation Age of Onset   High blood pressure Mother    High Cholesterol Mother    Heart disease Mother    Colon cancer Father    Cancer Father    High blood pressure Father    Colon polyps Neg Hx    Esophageal cancer Neg Hx  Stomach cancer Neg Hx    Rectal cancer Neg Hx     The following portions of the patient's history were reviewed and updated as appropriate: allergies, current medications, past family history, past medical history, past social history, past surgical history and problem list.  Review of Systems Pertinent items are noted in HPI.   Objective:  BP (!) 148/65   Pulse 65   Ht '5\' 3"'$  (1.6 m)   Wt 223 lb (101.2 kg)   BMI 39.50 kg/m  Wt Readings from Last 3 Encounters:  07/14/22 223 lb (101.2 kg)  06/14/22 224 lb (101.6 kg)  03/25/22 220 lb (99.8 kg)     Chaperone present during exam  CONSTITUTIONAL: Well-developed, well-nourished female in no acute distress.  HENT:  Normocephalic, atraumatic, External right and left ear normal. Oropharynx is clear and moist EYES: Conjunctivae and EOM are normal. Pupils are equal,  round, and reactive to light. No scleral icterus.  NECK: Normal range of motion, supple, no masses.  Normal thyroid.   CARDIOVASCULAR: Normal heart rate noted, regular rhythm RESPIRATORY: Clear to auscultation bilaterally. Effort and breath sounds normal, no problems with respiration noted. BREASTS: Symmetric in size. No masses, skin changes, nipple drainage, or lymphadenopathy. ABDOMEN: Soft, normal bowel sounds, no distention noted.  No tenderness, rebound or guarding.  PELVIC: Normal appearing external genitalia; normal appearing vaginal mucosa and cervix.  No abnormal discharge noted.  Normal uterine size, no other palpable masses, no uterine or adnexal tenderness. MUSCULOSKELETAL: Normal range of motion. No tenderness.  No cyanosis, clubbing, or edema.  2+ distal pulses. SKIN: Skin is warm and dry. No rash noted. Not diaphoretic. No erythema. No pallor. NEUROLOGIC: Alert and oriented to person, place, and time. Normal reflexes, muscle tone coordination. No cranial nerve deficit noted. PSYCHIATRIC: Normal mood and affect. Normal behavior. Normal judgment and thought content.  Assessment:  Annual gynecologic examination with pap smear   Plan:  1. Well Woman Exam Will follow up results of pap smear and manage accordingly. Mammogram reviewed.  Routine preventative health maintenance measures emphasized. Please refer to After Visit Summary for other counseling recommendations.    Loma Boston, [place] for [employer]

## 2022-07-18 LAB — CYTOLOGY - PAP
Comment: NEGATIVE
Diagnosis: NEGATIVE
High risk HPV: NEGATIVE

## 2022-09-13 ENCOUNTER — Ambulatory Visit: Payer: BC Managed Care – PPO | Admitting: Family

## 2022-10-06 ENCOUNTER — Encounter: Payer: Self-pay | Admitting: Family Medicine

## 2022-10-06 ENCOUNTER — Ambulatory Visit: Payer: BC Managed Care – PPO | Admitting: Family Medicine

## 2022-10-06 VITALS — BP 140/88 | HR 66 | Resp 18 | Ht 63.0 in | Wt 227.6 lb

## 2022-10-06 DIAGNOSIS — B029 Zoster without complications: Secondary | ICD-10-CM

## 2022-10-06 MED ORDER — VALACYCLOVIR HCL 1 G PO TABS: 1000.0000 mg | ORAL_TABLET | Freq: Three times a day (TID) | ORAL | 0 refills | Status: AC

## 2022-10-06 MED ORDER — PREDNISONE 10 MG PO TABS: ORAL_TABLET | ORAL | 0 refills | Status: AC

## 2022-10-06 NOTE — Progress Notes (Signed)
Acute Office Visit  Subjective:     Patient ID: Natasha [name], female    DOB: 07-25-1961, 61 y.o.   MRN: [id number]  Chief Complaint  Patient presents with   Rash    Pt states having rash at the back of her hair line and comes around her neck. Pt states husband had shingles the last several weeks.     Rash This is a new problem. The current episode started yesterday. The problem has been gradually worsening since onset. The affected locations include the neck. The rash is characterized by pain and itchiness (tingling, sensitive to light touch). She was exposed to nothing. Pertinent negatives include no diarrhea, fatigue, fever, rhinorrhea, shortness of breath or sore throat. Past treatments include nothing.  Rash is only on left side of neck, wrapping posterior to anterior but not crossing midline. The day prior to rash starting she noticed the area felt a little achy/uncomfortable.   All review of systems negative except what is listed in the HPI       Objective:    BP (!) 140/88 (BP Location: Left Arm, Patient Position: Sitting, Cuff Size: Normal)   Pulse 66   Resp 18   Ht '5\' 3"'$  (1.6 m)   Wt 227 lb 9.6 oz (103.2 kg)   SpO2 99%   BMI 40.32 kg/m    Physical Exam Vitals reviewed.  Constitutional:      Appearance: Normal appearance.  Pulmonary:     Effort: Pulmonary effort is normal.  Musculoskeletal:     Cervical back: Normal range of motion and neck supple.  Skin:    General: Skin is warm and dry.     Findings: Rash present.     Comments: Erythematous rash on left side of neck with clustered raised areas, but no fully formed blisters at this time (typical early presentation of shingles)  Neurological:     Mental Status: She is alert and oriented to person, place, and time.  Psychiatric:        Mood and Affect: Mood normal.        Behavior: Behavior normal.        Thought Content: Thought content normal.        Judgment: Judgment normal.        No results  found for any visits on 10/06/22.      Assessment & Plan:   Problem List Items Addressed This Visit   None Visit Diagnoses     Herpes zoster without complication    -  Primary Discussed options with patient. She would like to try antiviral and have prednisone available if pain worsens, but will try to avoid use. She is not interested in anything else for pain at this time. Patient aware of signs/symptoms requiring further/urgent evaluation.    Relevant Medications   valACYclovir (VALTREX) 1000 MG tablet   predniSONE (DELTASONE) 10 MG tablet       Meds ordered this encounter  Medications   valACYclovir (VALTREX) 1000 MG tablet    Sig: Take 1 tablet (1,000 mg total) by mouth 3 (three) times daily for 7 days.    Dispense:  21 tablet    Refill:  0    Order Specific Question:   Supervising Provider    Answer:   Penni Homans A [[id number]]   predniSONE (DELTASONE) 10 MG tablet    Sig: 4 tabs po daily x 3 days then 3 tabs po daily x 3 days then 2 tabs po daily x  3 days then 1 tab po daily x 3 days then STOP    Dispense:  30 tablet    Refill:  0    Order Specific Question:   Supervising Provider    Answer:   Penni Homans A [[id number]]    Return if symptoms worsen or fail to improve.  Terrilyn Saver, NP

## 2022-10-28 ENCOUNTER — Encounter: Payer: Self-pay | Admitting: Family

## 2022-10-28 ENCOUNTER — Other Ambulatory Visit: Payer: Self-pay | Admitting: Family

## 2023-03-06 ENCOUNTER — Encounter: Payer: Self-pay | Admitting: *Deleted

## 2023-10-06 ENCOUNTER — Other Ambulatory Visit (HOSPITAL_BASED_OUTPATIENT_CLINIC_OR_DEPARTMENT_OTHER): Payer: Self-pay | Admitting: Family

## 2023-10-06 DIAGNOSIS — Z1231 Encounter for screening mammogram for malignant neoplasm of breast: Secondary | ICD-10-CM

## 2023-11-09 ENCOUNTER — Encounter: Payer: BC Managed Care – PPO | Admitting: Family

## 2023-11-09 ENCOUNTER — Inpatient Hospital Stay (HOSPITAL_BASED_OUTPATIENT_CLINIC_OR_DEPARTMENT_OTHER): Admission: RE | Admit: 2023-11-09 | Payer: BC Managed Care – PPO | Source: Ambulatory Visit

## 2024-05-08 IMAGING — MR MR LUMBAR SPINE W/O CM
4 of 5 series · 27 of 48 positions shown · non-contrast
Comparison: None Available.

CLINICAL DATA: Low back pain radiating into the bilateral legs for
years

EXAM:
MRI LUMBAR SPINE WITHOUT CONTRAST
TECHNIQUE: Multiplanar, multisequence MR imaging of the lumbar spine was
performed. No intravenous contrast was administered.

[Series 3: T2 · sagittal · 4.0mm · 1.09mm/px · 6 of 17 slices shown (1 of 2)]
[im 1/17]
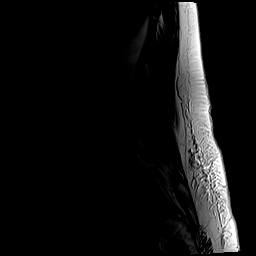
[im 4/17]
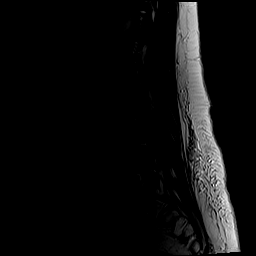
[im 7/17]
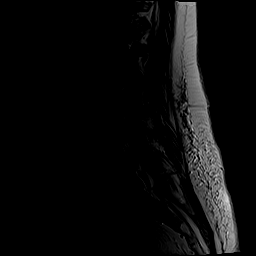
[im 10/17]
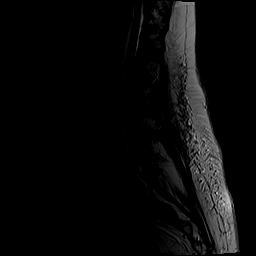
[im 13/17]
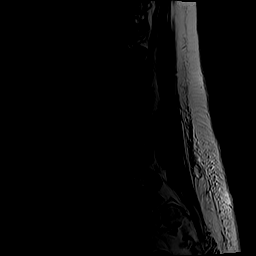
[im 17/17]
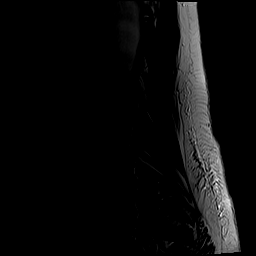

[Series 5: T1 · sagittal · 4.0mm · 1.09mm/px · 6 of 17 slices shown (1 of 2)]
[im 1/17]
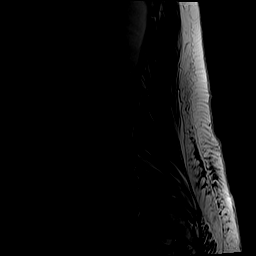
[im 4/17]
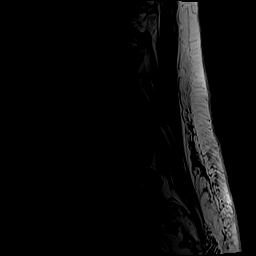
[im 7/17]
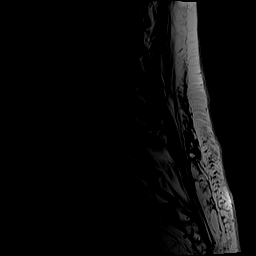
[im 10/17]
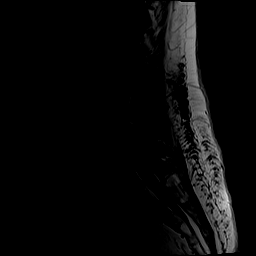
[im 13/17]
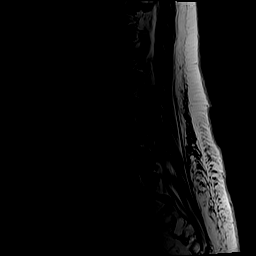
[im 17/17]
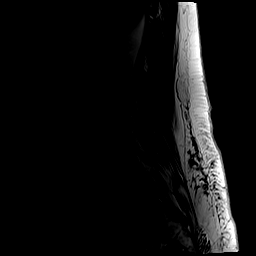

[Series 6: T2 · axial · 4.0mm · 0.39mm/px · z∈[-68,+159]mm · 9 of 41 slices shown (2 of 2)]
[im 1/41]
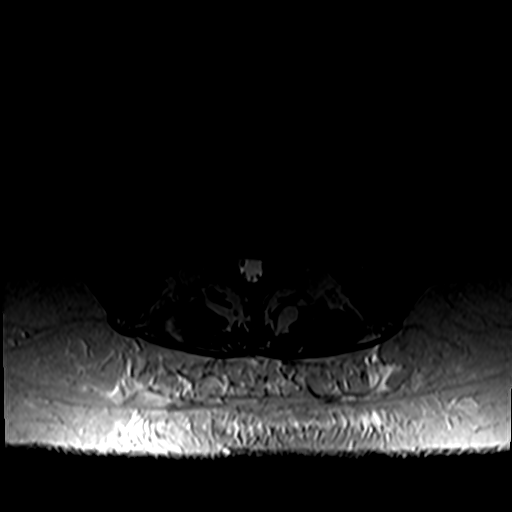
[im 6/41]
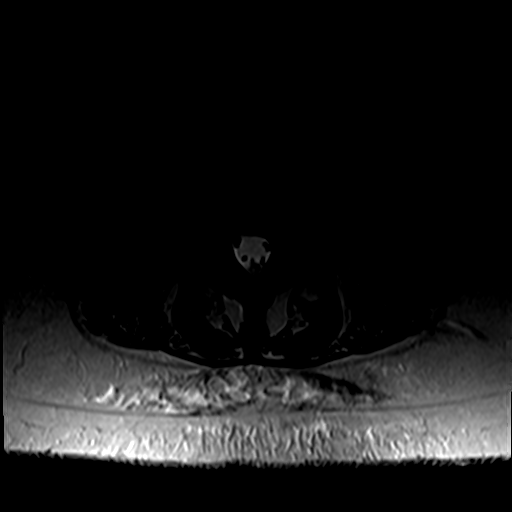
[im 12/41]
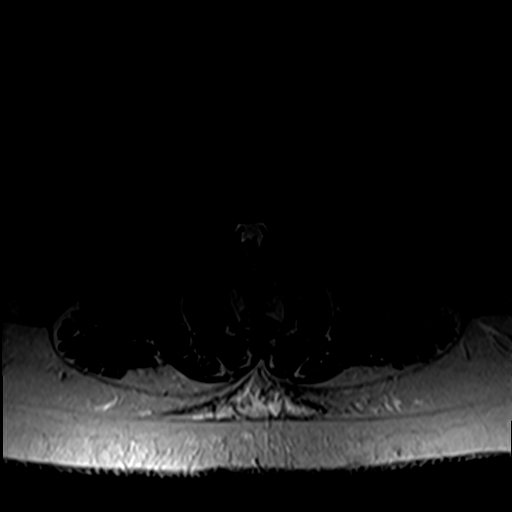
[im 18/41]
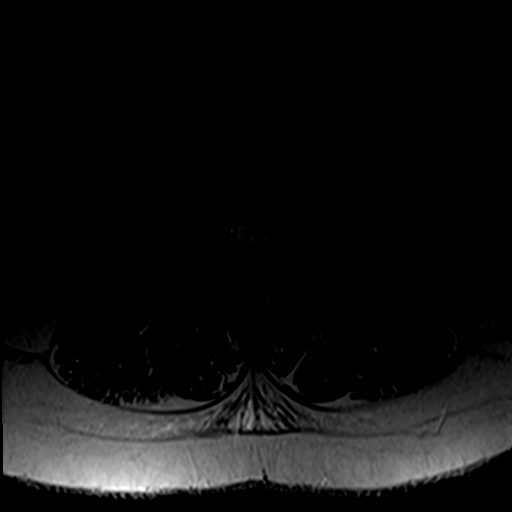
[im 21/41]
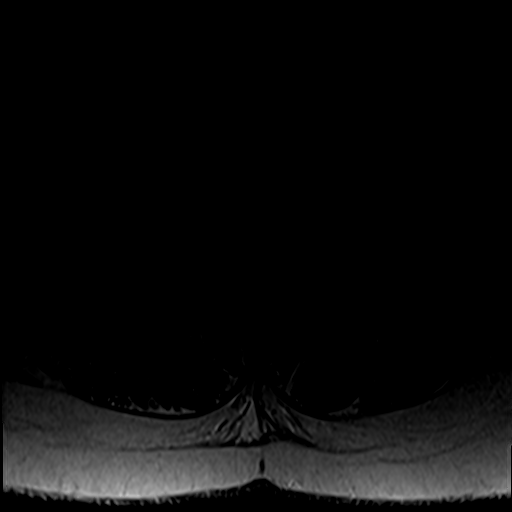
[im 23/41]
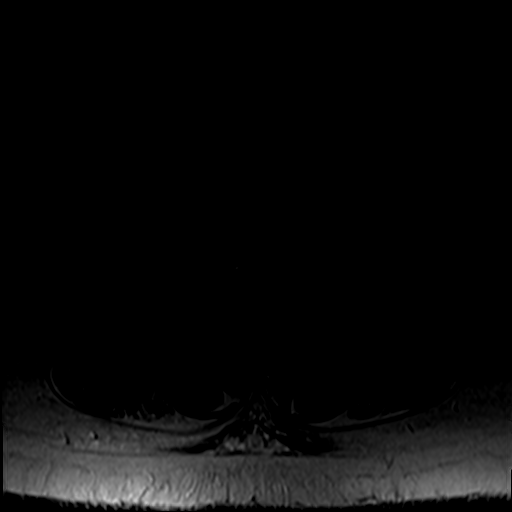
[im 29/41]
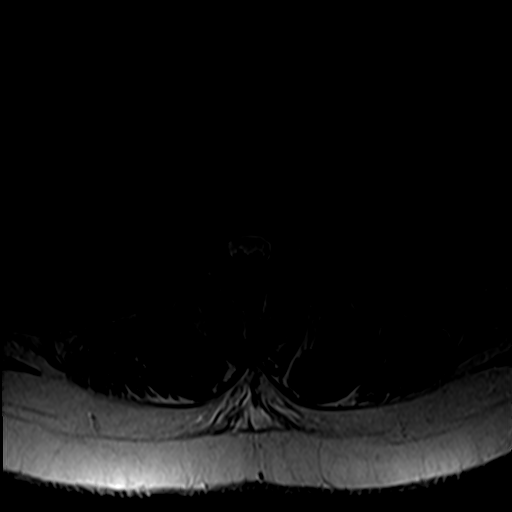
[im 35/41]
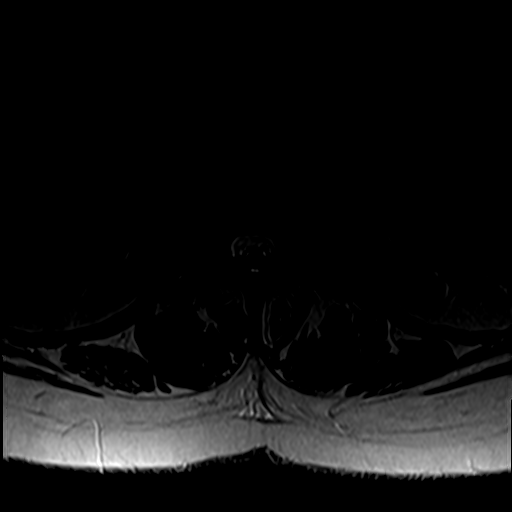
[im 41/41]
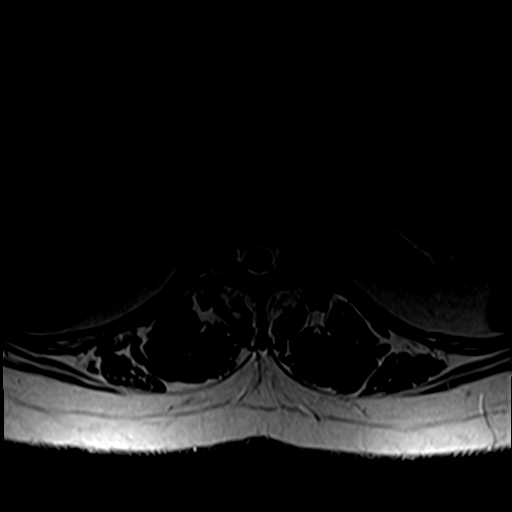

[Series 7: T1 · axial · 4.0mm · 0.39mm/px · z∈[-68,+131]mm · 6 of 41 slices shown (2 of 2)]
[im 1/41]
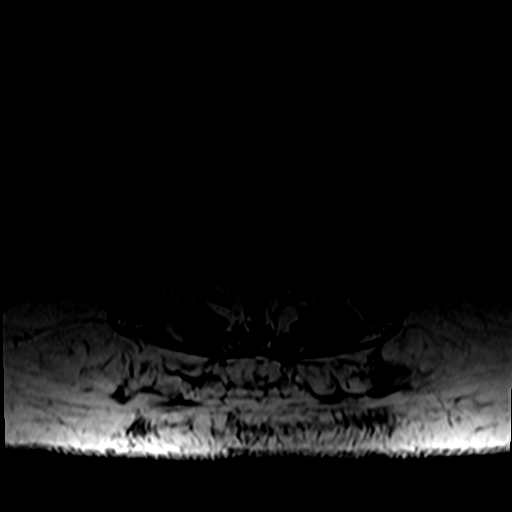
[im 6/41]
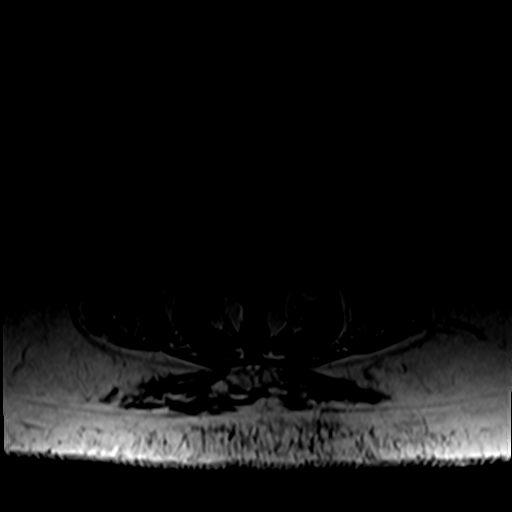
[im 12/41]
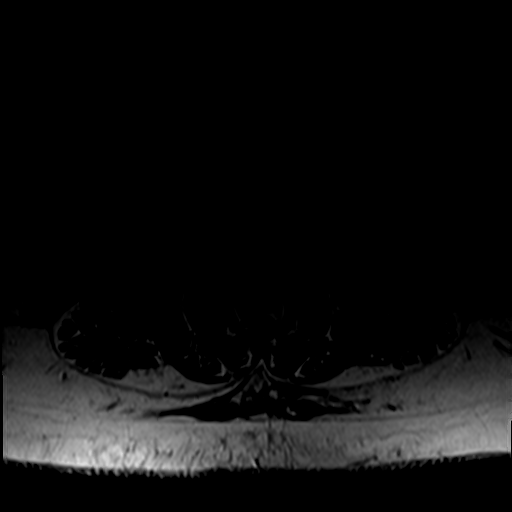
[im 18/41]
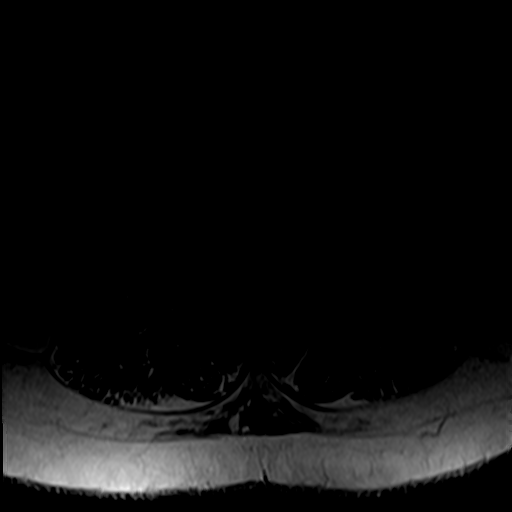
[im 21/41]
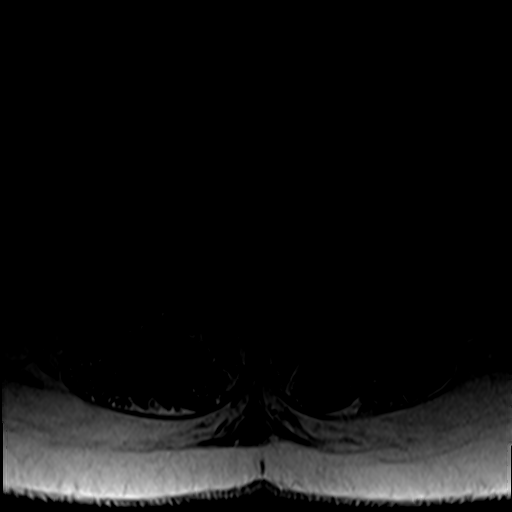
[im 35/41]
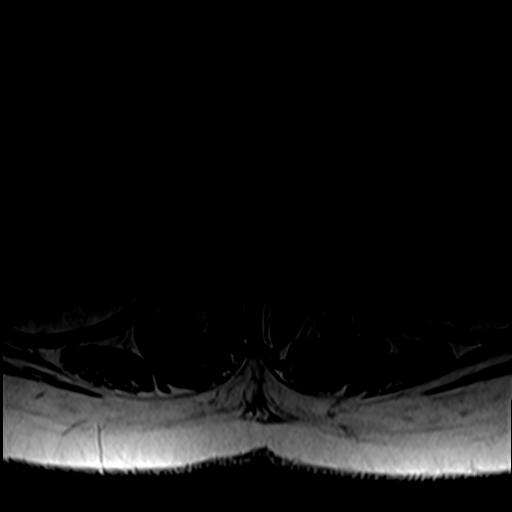

[27 of 48 positions shown; findings below may reference images not displayed]

FINDINGS: Segmentation:  5 lumbar type vertebrae

Alignment:  Borderline anterolisthesis at L4-5

Vertebrae:  No fracture, evidence of discitis, or bone lesion.

Conus medullaris and cauda equina: Conus extends to the L1-2 level.
Conus and cauda equina appear normal.

Paraspinal and other soft tissues: Interpolar left renal lesion
measuring 12 mm, T1 hyperintense and T2 hypointense, suggesting
proteinaceous/hemorrhagic cyst.

Disc levels:

T12- L1: Unremarkable.

L1-L2: Unremarkable.

L2-L3: Mild disc bulging and endplate degeneration

L3-L4: Mild degenerative facet spurring and disc bulging.

L4-L5: Moderate degenerative facet spurring. Disc space narrowing
and mild bulging

L5-S1:Moderate facet spurring. Disc space narrowing and bulging with
tiny central protrusion.
IMPRESSION: Generalized ordinary lumbar spine degeneration without neural
impingement or visible inflammation.

## 2025-01-28 ENCOUNTER — Encounter: Admitting: Family Medicine
